# Patient Record
Sex: Female | Born: 1990 | Race: White | Hispanic: No | Marital: Married
Health system: Southern US, Community
[De-identification: ages and names within clinical notes are randomized; demographics above are authoritative.]

## PROBLEM LIST (undated history)

## (undated) DIAGNOSIS — T7840XA Allergy, unspecified, initial encounter: Secondary | ICD-10-CM

## (undated) DIAGNOSIS — J45909 Unspecified asthma, uncomplicated: Secondary | ICD-10-CM

## (undated) DIAGNOSIS — Z87442 Personal history of urinary calculi: Secondary | ICD-10-CM

## (undated) DIAGNOSIS — F329 Major depressive disorder, single episode, unspecified: Secondary | ICD-10-CM

## (undated) DIAGNOSIS — F99 Mental disorder, not otherwise specified: Secondary | ICD-10-CM

## (undated) DIAGNOSIS — F32A Depression, unspecified: Secondary | ICD-10-CM

## (undated) DIAGNOSIS — F419 Anxiety disorder, unspecified: Secondary | ICD-10-CM

## (undated) HISTORY — DX: Allergy, unspecified, initial encounter: T78.40XA

## (undated) HISTORY — DX: Unspecified asthma, uncomplicated: J45.909

---
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
## 2011-11-11 ENCOUNTER — Encounter (HOSPITAL_COMMUNITY): Payer: Self-pay | Admitting: *Deleted

## 2011-11-11 DIAGNOSIS — R45851 Suicidal ideations: Secondary | ICD-10-CM | POA: Insufficient documentation

## 2011-11-11 NOTE — ED Notes (Addendum)
Pt states that she has been under a lot of stress with school work. Pt had a study guide for a test in on e of her hardest classes to complete this weekend and she asked for mom visit. Pt mom said she would see based off of how many questions she finished on her study guide. Pt only finished 21, mom said that she couldn't come. Pt started having thoughts of SI this afternoon, pt states she tried to choke herself a couple of times. Pt was also thinking but did not follow through with cutting herself by stabbing herself but she did not think she could do that, hanging herself but she did not have a rope. Pt very nervous and anxiety currently that her mom will think it is her fault. She keeps repeating that it is not her moms fault. Pt belongings removed and security waunded.

## 2011-11-12 ENCOUNTER — Emergency Department (HOSPITAL_COMMUNITY)
Admission: EM | Admit: 2011-11-12 | Discharge: 2011-11-12 | Disposition: A | Discharge status: Home / Self Care | Payer: BC Managed Care – PPO | Attending: Emergency Medicine | Admitting: Emergency Medicine

## 2011-11-12 DIAGNOSIS — F4320 Adjustment disorder, unspecified: Secondary | ICD-10-CM

## 2011-11-12 NOTE — ED Provider Notes (Signed)
History     CSN: [id number]  Arrival date & time 11/11/11  2137   First MD Initiated Contact with Patient 11/12/11 0017      Chief Complaint  Patient presents with  . Suicidal     Patient is a 21 y.o. female presenting with mental health disorder. The history is provided by the patient.  Mental Health Problem The primary symptoms include dysphoric mood. The current episode started today. This is a new problem.  The onset of the illness is precipitated by a stressful event. The degree of incapacity that she is experiencing as a consequence of her illness is mild. Additional symptoms of the illness include poor judgment.  her course is improving Patient reports that she is under a lot of stress and she tried to harm herself - she tried choking herself with her bare hands.  She took 6 ibuprofen for a headache but no other drugs were ingested.  No cutting was reported.  She denies LOC from the choking episode.  No difficulty swallowing.  No difficulty breathing is reported  She now feels remorse and wish she hadn't hurt herself  PMH - none  History reviewed. No pertinent past surgical history.  History reviewed. No pertinent family history.  History  Substance Use Topics  . Smoking status: Not on file  . Smokeless tobacco: Not on file  . Alcohol Use: Not on file    OB History    Grav Para Term Preterm Abortions TAB SAB Ect Mult Living                  Review of Systems  Constitutional: Negative for fever.  Gastrointestinal: Negative for vomiting.  Neurological: Negative for weakness.  Psychiatric/Behavioral: Positive for dysphoric mood.  All other systems reviewed and are negative.    Allergies  Review of patient's allergies indicates no known allergies.  Home Medications   Current Outpatient Rx  Name Route Sig Dispense Refill  . VITAMIN B COMPLEX PO Oral Take 1 tablet by mouth daily.    . IBUPROFEN 200 MG PO TABS Oral Take 400 mg by mouth every 6 (six) hours as  needed. For pain    . MINOCYCLINE HCL 100 MG PO CAPS Oral Take 100 mg by mouth daily.    Marland Kitchen PRESCRIPTION MEDICATION Oral Take 1 tablet by mouth daily. Oral contraceptive      BP 151/89  Pulse 71  Temp 98.4 F (36.9 C) (Oral)  Resp 16  Ht 5\' 8"  (1.727 m)  Wt 137 lb (62.143 kg)  BMI 20.83 kg/m2  SpO2 99%  Physical Exam CONSTITUTIONAL: Well developed/well nourished HEAD AND FACE: Normocephalic/atraumatic EYES: EOMI/PERRL ENMT: Mucous membranes moist NECK: supple no meningeal signs. No signs of anterior neck trauma SPINE:entire spine nontender CV: S1/S2 noted, no murmurs/rubs/gallops noted LUNGS: Lungs are clear to auscultation bilaterally, no apparent distress ABDOMEN: soft, nontender, no rebound or guarding GU:no cva tenderness NEURO: Pt is awake/alert, moves all extremitiesx4 EXTREMITIES: pulses normal, full ROM SKIN: warm, color normal PSYCH: anxious  ED Course  Procedures   Labs Reviewed  CBC  COMPREHENSIVE METABOLIC PANEL  URINE RAPID DRUG SCREEN (HOSP PERFORMED)  SALICYLATE LEVEL  ACETAMINOPHEN LEVEL  ETHANOL   2:10 AM Pt stable Sitter at bedside D/w ACT Also will consult telepsych  2:38 AM Seen by telepsych (dr Jacky Kindle) He feels patient is safe for d/c She denies current SI She feels safe at home She will see a counselor this week She is awake/alert, appears appropriate  MDM  Nursing notes including past medical history and social history reviewed and considered in documentation Labs/vital reviewed and considered         Joya Gaskins, MD 11/12/11 [phone]

## 2011-11-12 NOTE — ED Notes (Signed)
The patient is AOx4 and comfortable with her discharge instructions. 

## 2013-04-08 ENCOUNTER — Encounter (HOSPITAL_COMMUNITY): Payer: Self-pay | Admitting: Emergency Medicine

## 2013-04-08 ENCOUNTER — Emergency Department (HOSPITAL_COMMUNITY)
Admission: EM | Admit: 2013-04-08 | Discharge: 2013-04-09 | Disposition: A | Discharge status: Other Acute Inpatient Hospital | Payer: BC Managed Care – PPO | Attending: Emergency Medicine | Admitting: Emergency Medicine

## 2013-04-08 DIAGNOSIS — T4272XA Poisoning by unspecified antiepileptic and sedative-hypnotic drugs, intentional self-harm, initial encounter: Secondary | ICD-10-CM | POA: Insufficient documentation

## 2013-04-08 DIAGNOSIS — Z3202 Encounter for pregnancy test, result negative: Secondary | ICD-10-CM | POA: Insufficient documentation

## 2013-04-08 DIAGNOSIS — T50901A Poisoning by unspecified drugs, medicaments and biological substances, accidental (unintentional), initial encounter: Secondary | ICD-10-CM | POA: Diagnosis present

## 2013-04-08 DIAGNOSIS — Z79899 Other long term (current) drug therapy: Secondary | ICD-10-CM | POA: Insufficient documentation

## 2013-04-08 DIAGNOSIS — R45851 Suicidal ideations: Secondary | ICD-10-CM

## 2013-04-08 DIAGNOSIS — T426X1A Poisoning by other antiepileptic and sedative-hypnotic drugs, accidental (unintentional), initial encounter: Secondary | ICD-10-CM | POA: Insufficient documentation

## 2013-04-08 DIAGNOSIS — T4271XA Poisoning by unspecified antiepileptic and sedative-hypnotic drugs, accidental (unintentional), initial encounter: Principal | ICD-10-CM

## 2013-04-08 DIAGNOSIS — R109 Unspecified abdominal pain: Secondary | ICD-10-CM | POA: Insufficient documentation

## 2013-04-08 DIAGNOSIS — F411 Generalized anxiety disorder: Secondary | ICD-10-CM | POA: Insufficient documentation

## 2013-04-08 DIAGNOSIS — T426X2A Poisoning by other antiepileptic and sedative-hypnotic drugs, intentional self-harm, initial encounter: Secondary | ICD-10-CM | POA: Insufficient documentation

## 2013-04-08 HISTORY — DX: Mental disorder, not otherwise specified: F99

## 2013-04-08 HISTORY — DX: Anxiety disorder, unspecified: F41.9

## 2013-04-08 HISTORY — DX: Depression, unspecified: F32.A

## 2013-04-08 HISTORY — DX: Major depressive disorder, single episode, unspecified: F32.9

## 2013-04-08 LAB — RAPID URINE DRUG SCREEN, HOSP PERFORMED
AMPHETAMINES: NOT DETECTED
BARBITURATES: NOT DETECTED
Benzodiazepines: NOT DETECTED
Cocaine: NOT DETECTED
Opiates: NOT DETECTED
TETRAHYDROCANNABINOL: NOT DETECTED

## 2013-04-08 LAB — URINALYSIS, ROUTINE W REFLEX MICROSCOPIC
BILIRUBIN URINE: NEGATIVE
Glucose, UA: NEGATIVE mg/dL
Hgb urine dipstick: NEGATIVE
KETONES UR: NEGATIVE mg/dL
Nitrite: NEGATIVE
PH: 8 (ref 5.0–8.0)
Protein, ur: NEGATIVE mg/dL
Specific Gravity, Urine: 1.02 (ref 1.005–1.030)
UROBILINOGEN UA: 0.2 mg/dL (ref 0.0–1.0)

## 2013-04-08 LAB — CBC
HCT: 42.3 % (ref 36.0–46.0)
Hemoglobin: 14.6 g/dL (ref 12.0–15.0)
MCH: 30.8 pg (ref 26.0–34.0)
MCHC: 34.5 g/dL (ref 30.0–36.0)
MCV: 89.2 fL (ref 78.0–100.0)
Platelets: 214 10*3/uL (ref 150–400)
RBC: 4.74 MIL/uL (ref 3.87–5.11)
RDW: 12.3 % (ref 11.5–15.5)
WBC: 8.3 10*3/uL (ref 4.0–10.5)

## 2013-04-08 LAB — COMPREHENSIVE METABOLIC PANEL
ALT: 11 U/L (ref 0–35)
AST: 18 U/L (ref 0–37)
Albumin: 4.2 g/dL (ref 3.5–5.2)
Alkaline Phosphatase: 68 U/L (ref 39–117)
BUN: 6 mg/dL (ref 6–23)
CO2: 23 mEq/L (ref 19–32)
Calcium: 9.6 mg/dL (ref 8.4–10.5)
Chloride: 101 mEq/L (ref 96–112)
Creatinine, Ser: 0.69 mg/dL (ref 0.50–1.10)
GFR calc Af Amer: >90 mL/min (ref >90)
GFR calc non Af Amer: >90 mL/min (ref >90)
Glucose, Bld: 93 mg/dL (ref 70–99)
Potassium: 4.3 mEq/L (ref 3.7–5.3)
Sodium: 138 mEq/L (ref 137–147)
Total Bilirubin: 1.8 mg/dL — ABNORMAL HIGH (ref 0.3–1.2)
Total Protein: 7.9 g/dL (ref 6.0–8.3)

## 2013-04-08 LAB — URINE MICROSCOPIC-ADD ON

## 2013-04-08 LAB — SALICYLATE LEVEL: Salicylate Lvl: <2 mg/dL — ABNORMAL LOW (ref 2.8–20.0)

## 2013-04-08 LAB — POC URINE PREG, ED: Preg Test, Ur: NEGATIVE

## 2013-04-08 LAB — ETHANOL: Alcohol, Ethyl (B): <11 mg/dL (ref 0–11)

## 2013-04-08 LAB — ACETAMINOPHEN LEVEL: Acetaminophen (Tylenol), Serum: <15 ug/mL (ref 10–30)

## 2013-04-08 MED ORDER — LORAZEPAM 0.5 MG PO TABS: 0.5000 mg | ORAL_TABLET | Freq: Three times a day (TID) | ORAL | Status: DC | PRN

## 2013-04-08 MED ORDER — LORAZEPAM 1 MG PO TABS: 1.0000 mg | ORAL_TABLET | Freq: Three times a day (TID) | ORAL | Status: DC | PRN

## 2013-04-08 NOTE — ED Notes (Signed)
Bed: WA08 Expected date:  Expected time:  Means of arrival:  Comments: OD

## 2013-04-08 NOTE — BH Assessment (Signed)
Spoke with EDP Dr. Romeo AppleHarrison to obtain clinicals prior to assessing patient. Dr.Harrison reports that he is in a procedure and is unable to talk with TTS at this time. EDP agreed to call TTS when he is available.   Glorious PeachNajah , MS, LCASA Assessment Counselor

## 2013-04-08 NOTE — ED Notes (Signed)
Patient observed in bed. Appears sad. Denies SI, HI, AVH at present. Rates anxiety 4/10, depression 5/10. Contracts verbally for safety. Patient is calm, cooperative.   Encouragement offered. Patient safety maintained, Q 15 checks in place.

## 2013-04-08 NOTE — ED Notes (Signed)
Pt ambulatory with steady gait to void in BR.  

## 2013-04-08 NOTE — Progress Notes (Signed)
[facility] [employer] has called off drivers d/t current weather conditions. They plan to wait until roads have been cleared in the morning before transporting.

## 2013-04-08 NOTE — ED Notes (Signed)
Follow up from previous shift: EDP Rebecca Clay reviewed U/A, sees no need for intervention at present.

## 2013-04-08 NOTE — BH Assessment (Signed)
TTS notified by patient's nurse Carrolyn LeighMegan Rhodes that [facility] by [facility]. Due to road conditions [facility] is unable to transport patient at this time.  Glorious PeachNajah , MS, LCASA Assessment Counselor

## 2013-04-08 NOTE — ED Notes (Addendum)
Patient and belongings have been wanded by security. Patient belongings are gray sweat pants, navy boxers, white socks, tan shoes, wallet, black shirt, silver ring, silver stud earrings, green watch, silver necklace. Belongings are locked up in TCU locker 26.

## 2013-04-08 NOTE — BH Assessment (Signed)
TTS consult complete.   , MS, LCASA Assessment Counselor  

## 2013-04-08 NOTE — Progress Notes (Signed)
Patient spoke with mother Estanislado SpireKaren Peedin. Became tearful.  Patient safety maintained, Q 15 checks continue.

## 2013-04-08 NOTE — ED Provider Notes (Signed)
CSN: [id number]     Arrival date & time 04/08/13  1423 History   First MD Initiated Contact with Patient 04/08/13 1453     Chief Complaint  Patient presents with  . Drug Overdose     (Consider location/radiation/quality/duration/timing/severity/associated sxs/prior Treatment) Patient is a [age] y.o. female presenting with Overdose. The history is provided by the patient.  Drug Overdose This is a new problem. The current episode started 12 to 24 hours ago. Episode frequency: once. The problem has been rapidly improving. Associated symptoms include abdominal pain. Pertinent negatives include no chest pain, no headaches and no shortness of breath. Exacerbated by: recent separation from partner. Nothing relieves the symptoms. She has tried nothing for the symptoms. The treatment provided no relief.    Past Medical History  Diagnosis Date  . Anxiety    History reviewed. No pertinent past surgical history. No family history on file. History  Substance Use Topics  . Smoking status: Never Smoker   . Smokeless tobacco: Not on file  . Alcohol Use: Yes   OB History   Grav Para Term Preterm Abortions TAB SAB Ect Mult Living                 Review of Systems  Constitutional: Negative for fever and fatigue.  HENT: Negative for congestion and drooling.   Eyes: Negative for pain.  Respiratory: Negative for cough and shortness of breath.   Cardiovascular: Negative for chest pain.  Gastrointestinal: Positive for abdominal pain. Negative for nausea, vomiting and diarrhea.  Genitourinary: Negative for dysuria and hematuria.  Musculoskeletal: Negative for back pain, gait problem and neck pain.  Skin: Negative for color change.  Neurological: Negative for dizziness and headaches.  Hematological: Negative for adenopathy.  Psychiatric/Behavioral: Negative for behavioral problems.  All other systems reviewed and are negative.      Allergies  Review of patient's allergies indicates no known  allergies.  Home Medications   Current Outpatient Rx  Name  Route  Sig  Dispense  Refill  . escitalopram (LEXAPRO) 20 MG tablet   Oral   Take 1 tablet by mouth at bedtime.         Marland Kitchen. PRESCRIPTION MEDICATION   Oral   Take 1 tablet by mouth daily. Oral contraceptive         . zolpidem (AMBIEN) 5 MG tablet   Oral   Take 1 tablet by mouth at bedtime as needed.          BP 140/96  Pulse 113  Temp(Src) 98.2 F (36.8 C) (Oral)  Resp 20  SpO2 96% Physical Exam  Nursing note and vitals reviewed. Constitutional: She is oriented to person, place, and time. She appears well-developed and well-nourished.  HENT:  Head: Normocephalic and atraumatic.  Mouth/Throat: Oropharynx is clear and moist. No oropharyngeal exudate.  Eyes: Conjunctivae and EOM are normal. Pupils are equal, round, and reactive to light.  Neck: Normal range of motion. Neck supple.  Cardiovascular: Normal rate, regular rhythm, normal heart sounds and intact distal pulses.  Exam reveals no gallop and no friction rub.   No murmur heard. Pulmonary/Chest: Effort normal and breath sounds normal. No respiratory distress. She has no wheezes.  Abdominal: Soft. Bowel sounds are normal. There is no tenderness. There is no rebound and no guarding.  Musculoskeletal: Normal range of motion. She exhibits no edema and no tenderness.  Several horizontal abrasions to the left ventral forearm.  Neurological: She is alert and oriented to person, place, and time.  Skin: Skin is warm and dry.  Psychiatric: She has a normal mood and affect. Her behavior is normal.    ED Course  Procedures (including critical care time) Labs Review Labs Reviewed  COMPREHENSIVE METABOLIC PANEL - Abnormal; Notable for the following:    Total Bilirubin 1.8 (*)    All other components within normal limits  SALICYLATE LEVEL - Abnormal; Notable for the following:    Salicylate Lvl <2.0 (*)    All other components within normal limits  URINALYSIS,  ROUTINE W REFLEX MICROSCOPIC - Abnormal; Notable for the following:    APPearance TURBID (*)    Leukocytes, UA SMALL (*)    All other components within normal limits  URINE MICROSCOPIC-ADD ON - Abnormal; Notable for the following:    Squamous Epithelial / LPF FEW (*)    Bacteria, UA FEW (*)    All other components within normal limits  CBC  ETHANOL  ACETAMINOPHEN LEVEL  URINE RAPID DRUG SCREEN (HOSP PERFORMED)  POC URINE PREG, ED   Imaging Review No results found.  EKG Interpretation    Date/Time:  Wednesday April 08 2013 14:32:45 EST Ventricular Rate:  105 PR Interval:  160 QRS Duration: 96 QT Interval:  364 QTC Calculation: 481 R Axis:   42 Text Interpretation:  Sinus tachycardia RSR' in V1 or V2, probably normal variant Borderline prolonged QT interval Baseline wander in lead(s) I Confirmed by   MD,  ([id number]) on 04/08/2013 10:54:34 PM            MDM   Final diagnoses:  Overdose  Suicidal ideations    3:32 PM 23 y.o. female who presents with attempted overdose. The patient states that she was upset about a recent separation with a partner. She states that she took less than 15 tablets of 5 mg Ambien last night sometime between midnight and 1 AM. She denies any other coingestants. She woke at 1 PM today and her friend called the ambulance. She states that she felt disoriented at that time. She is alert and oriented currently. She complains of some mild periumbilical pain has no other complaints. She is afebrile and vital signs are unremarkable on my exam. Will get screening labs and psych consult.  8:49 PM Medically cleared. Accepted to [facility]. Will transfer.   Junius Argyle, MD 04/08/13 2255

## 2013-04-08 NOTE — ED Notes (Signed)
Removed capped IV from left anicubital.

## 2013-04-08 NOTE — ED Notes (Signed)
Per EMS: pt took unknown amount of zolpidem last night around midnight. Pt was attempting to hurt herself. Pt has fresh cut marks on left wrist, scabbed over. 20 g left AC.

## 2013-04-08 NOTE — ED Notes (Signed)
Scheduled medications to pharmacy.  TTS in to assess.

## 2013-04-08 NOTE — BH Assessment (Addendum)
Assessment Note  Rebecca Clay is an 23 y.o. female. [facility] with [facility]/O Medical Clearance after intentional overdose on an unknown amount of her ambien sleeping pills. Patient reports that she recently broke up with her partner on 02-23-13. Patient reports that her ex-partner sent a message to her on Facebook several days ago telling patient that she wanted to "fuck" her,this was clarified as patient's ex-partner wanting patient to come over and have sex with her. Patient reports that she broke down and began crying. Patient states that she felt that she could no longer continue in the relationship with the perks to include sex, kissing as patient describes the perks of the relationship as a "fun bubble". Pt reports that she did not want to continue in the relationship without a commitment. Patient reports that she informed her partner that she could not continue in the relationship without a commitment and her ex-partner informed her that she no longer wanted any contact with the patient. Patient reports that his made her feel sad and sent her over the edge. Patient begins crying during assessment reporting that she misses her ex-girlfriend and can't cope with not being with her. Patient reports that she cut on her arm on 04-03-13 after having her last conversation with her ex-girlfriend. Pt denies prior history of cutting.Pt reports prior history of suicide attempt in the fall of 2014 when she started college. Patient reports that she could not handle the stress and attempted to kill herself by choking herself. Pt reports that she has been diagnosed with Auditory Processing Recessive Disorder Learning Disability. Pt denies HI and no AVH reported. Patient is unable to contract for safety and inpatient treatment is recommended for safety and stabilization.  Consulted with AC Thurman CoyerEric Kaplan and Donell SievertSpencer Simon Psychiatric extender who agreed to [facility]. Notified EDP Dr.Harrison  [facility]. Pt is assigned to 508-1 assigned to Dr.Jonnalagadda for care.Support paperwork complete.       Axis I: Major Depressive Disorder,Recurrent Severe without Psychotic Features Axis II: Deferred Axis III:  Past Medical History  Diagnosis Date  . Anxiety   . Mental disorder   . Depression    Axis IV: other psychosocial or environmental problems and problems related to social environment Axis V: 31-40 impairment in reality testing  Past Medical History:  Past Medical History  Diagnosis Date  . Anxiety   . Mental disorder   . Depression     History reviewed. No pertinent past surgical history.  Family History: No family history on file.  Social History:  reports that she has never smoked. She does not have any smokeless tobacco history on file. She reports that she drinks alcohol. She reports that she does not use illicit drugs.  Additional Social History:  Alcohol / Drug Use History of alcohol / drug use?: No history of alcohol / drug abuse  CIWA: CIWA-Ar BP: 152/99 mmHg Pulse Rate: 105 COWS:    Allergies: No Known Allergies  Home Medications:  (Not in a hospital admission)  OB/GYN Status:  No LMP recorded.  General Assessment Data Location of Assessment: [facility] ED Is this a Tele or Face-to-Face Assessment?: Face-to-Face Is this an Initial Assessment or a Re-assessment for this encounter?: Initial Assessment Living Arrangements: Other (Comment) (Pt lives in campus apt. with 3 roommates) Can pt return to current living arrangement?: Yes Admission Status: Voluntary Is patient capable of signing voluntary admission?: Yes Transfer from: Home Referral Source: Other ([facility])  [facility] Crisis Care Plan Living Arrangements: Other (Comment) (Pt lives in campus apt. with 3 roommates) Name of Psychiatrist: Dr. Bobby Rumpf at [facility] Name of Therapist: [facility]  Education Status Is patient currently in school?: Yes (Patient  currently attends [employer]) Current Grade: Junior Classification Highest grade of school patient has completed: 12th grade and 2 years of college Name of school: Haematologist person: NA  Risk to self Suicidal Ideation: Yes-Currently Present Suicidal Intent: Yes-Currently Present Is patient at risk for suicide?: Yes Suicidal Plan?: Yes-Currently Present Specify Current Suicidal Plan: Pt overdosed on unknown amount of ambien Access to Means: Yes Specify Access to Suicidal Means: pt access to rx meds What has been your use of drugs/alcohol within the last 12 months?: None Reported Previous Attempts/Gestures: Yes How many times?: 1 (prior overdose in the fall of 2013 when pt started college) Other Self Harm Risks: Cutting Triggers for Past Attempts: Other personal contacts (recent break up with partner ) Intentional Self Injurious Behavior: Cutting Comment - Self Injurious Behavior: pt recently cut on her arms on 04-03-13 due to conflict with her ex partner Family Suicide History: No Recent stressful life event(s): Other (Comment) (academic stress,difficulty keeping up with coursework) Persecutory voices/beliefs?: No Depression: Yes Depression Symptoms: Tearfulness;Feeling worthless/self pity;Feeling angry/irritable Substance abuse history and/or treatment for substance abuse?: No Suicide prevention information given to non-admitted patients: Not applicable  Risk to Others Homicidal Ideation: No Thoughts of Harm to Others: No Current Homicidal Intent: No Current Homicidal Plan: No Access to Homicidal Means: No Identified Victim: na History of harm to others?: No Assessment of Violence: None Noted Violent Behavior Description: Cooperative,tearful during TTS assessment Does patient have access to weapons?: No Criminal Charges Pending?: No Does patient have a court date: No  Psychosis Hallucinations: None noted Delusions: None noted  Mental Status Report Appear/Hygiene:   (Appropriate) Eye Contact: Fair Motor Activity: Unremarkable Speech: Logical/coherent Level of Consciousness: Alert Mood: Depressed Affect: Depressed Anxiety Level: Minimal Thought Processes: Coherent;Relevant Judgement: Unimpaired Orientation: Person;Place;Time;Situation Obsessive Compulsive Thoughts/Behaviors: None  Cognitive Functioning Concentration: Normal Memory: Recent Intact;Remote Intact IQ: Average Insight: Good Impulse Control: Poor Appetite: Good Weight Loss: 0 Weight Gain: 0 Sleep: No Change Total Hours of Sleep: 6 Vegetative Symptoms: None  ADLScreening [facility] Assessment Services) Patient's cognitive ability adequate to safely complete daily activities?: Yes Patient able to express need for assistance with ADLs?: Yes Independently performs ADLs?: Yes (appropriate for developmental age)  Prior Inpatient Therapy Prior Inpatient Therapy: Yes Prior Therapy Dates: 2013? Prior Therapy Facilty/Provider(s): [facility] Reason for Treatment: SI,(pt reports that she tried to  choke herself)  Prior Outpatient Therapy Prior Outpatient Therapy: Yes Prior Therapy Dates: Current Provider Prior Therapy Facilty/Provider(s): [facility], [employer] Reason for Treatment: Outpatient Therapy for relationship conflict and family discord  ADL Screening (condition at time of admission) Patient's cognitive ability adequate to safely complete daily activities?: Yes Is the patient deaf or have difficulty hearing?: No Does the patient have difficulty seeing, even when wearing glasses/contacts?: No Does the patient have difficulty concentrating, remembering, or making decisions?: No Patient able to express need for assistance with ADLs?: Yes Does the patient have difficulty dressing or bathing?: No Independently performs ADLs?: Yes (appropriate for developmental age) Does the patient have difficulty walking or climbing stairs?: No Weakness of Legs: None Weakness of Arms/Hands:  None  Home Assistive Devices/Equipment Home Assistive Devices/Equipment: None    Abuse/Neglect Assessment (Assessment to be complete while patient is alone) Physical Abuse: Denies Verbal Abuse: Denies Sexual Abuse: Denies  Exploitation of patient/patient's resources: Denies Self-Neglect: Denies Values / Beliefs Cultural Requests During Hospitalization: None Spiritual Requests During Hospitalization: None   Advance Directives (For Healthcare) Advance Directive: Patient does not have advance directive;Patient would not like information    Additional Information 1:1 In Past 12 Months?: No CIRT Risk: No Elopement Risk: No Does patient have medical clearance?: Yes     Disposition:  Disposition Initial Assessment Completed for this Encounter: Yes Disposition of Patient: Inpatient treatment program Type of inpatient treatment program: Adult  On Site Evaluation by:   Reviewed with Physician:    Gerline Legacy, MS, LCASA Assessment Counselor  04/08/2013 10:16 PM

## 2013-04-08 NOTE — BH Assessment (Signed)
Consulted with AC Thurman CoyerEric Kaplan and Donell SievertSpencer Simon Psychiatric extender who agreed to [facility]. Not[facility] EDP Dr.Harrison [facility]. Pt is assigned to 508-1 assigned to Dr.Jonnalagadda for care.Support paperwork complete.  Glorious PeachNajah , MS, LCASA Assessment Counselor

## 2013-04-09 ENCOUNTER — Encounter (HOSPITAL_COMMUNITY): Payer: Self-pay | Admitting: Behavioral Health

## 2013-04-09 ENCOUNTER — Encounter (HOSPITAL_COMMUNITY): Payer: Self-pay | Admitting: Registered Nurse

## 2013-04-09 ENCOUNTER — Inpatient Hospital Stay (HOSPITAL_COMMUNITY)
Admission: AD | Admit: 2013-04-09 | Discharge: 2013-04-13 | DRG: 885 | Disposition: A | Discharge status: Home / Self Care | Payer: 59 | Source: Intra-hospital | Attending: Psychiatry | Admitting: Psychiatry

## 2013-04-09 DIAGNOSIS — T50901A Poisoning by unspecified drugs, medicaments and biological substances, accidental (unintentional), initial encounter: Secondary | ICD-10-CM

## 2013-04-09 DIAGNOSIS — R45851 Suicidal ideations: Secondary | ICD-10-CM

## 2013-04-09 DIAGNOSIS — F332 Major depressive disorder, recurrent severe without psychotic features: Principal | ICD-10-CM | POA: Diagnosis present

## 2013-04-09 MED ORDER — ESCITALOPRAM OXALATE 20 MG PO TABS
20.0000 mg | ORAL_TABLET | Freq: Every day | ORAL | Status: DC
  Administered 2013-04-09 – 2013-04-13 (×5): 20 mg via ORAL
  Filled 2013-04-09 (×3): qty 1
  Filled 2013-04-09: qty 7
  Filled 2013-04-09 (×4): qty 1

## 2013-04-09 MED ORDER — TRAZODONE HCL 50 MG PO TABS
50.0000 mg | ORAL_TABLET | Freq: Every evening | ORAL | Status: DC | PRN
  Filled 2013-04-09: qty 7

## 2013-04-09 MED ORDER — ACETAMINOPHEN 325 MG PO TABS
650.0000 mg | ORAL_TABLET | Freq: Four times a day (QID) | ORAL | Status: DC | PRN
  Administered 2013-04-10: 650 mg via ORAL
  Filled 2013-04-09: qty 2

## 2013-04-09 MED ORDER — HYDROXYZINE HCL 25 MG PO TABS: 25.0000 mg | ORAL_TABLET | Freq: Four times a day (QID) | ORAL | Status: DC | PRN

## 2013-04-09 MED ORDER — MAGNESIUM HYDROXIDE 400 MG/5ML PO SUSP: 30.0000 mL | Freq: Every day | ORAL | Status: DC | PRN

## 2013-04-09 MED ORDER — ALUM & MAG HYDROXIDE-SIMETH 200-200-20 MG/5ML PO SUSP: 30.0000 mL | ORAL | Status: DC | PRN

## 2013-04-09 NOTE — Progress Notes (Signed)
Adult Psychoeducational Group Note  Date:  04/09/2013 Time: 8:30 PM  Group Topic/Focus:  Wrap-Up Group:   The focus of this group is to help patients review their daily goal of treatment and discuss progress on daily workbooks.  Participation Level:  Minimal  Participation Quality:  Appropriate and Attentive  Affect:  Appropriate  Cognitive:  Appropriate  Insight: Appropriate and Good  Engagement in Group:  Engaged  Modes of Intervention:  Discussion and Support  Additional Comments:  Pt shared that in her leisure time she likes to listen to music, draw, and hang out with her friends.   Humberto SealsWhitaker,  Monique 04/09/2013, 10:10 PM

## 2013-04-09 NOTE — Progress Notes (Signed)
Recreation Therapy Notes  Date: 02.26.2015 Time: 2:45pm Location: [address] Dayroom   Group Topic: Leisure Education  Goal Area(s) Addresses:  Patient will identify positive leisure activities.  Patient will identify positive emotions associated with leisure participation.  Patient will identify one positive benefit of participation in leisure activities.   Behavioral Response: Engaged, Appropriate   Intervention: Game  Activity: LRT selected a letter from a bag, using the selected letter patients were asked to identify as many leisure activities as possible that start with the selected letter in 3 minutes. Patients worked in teams of 4. Last round of game was used to ask patients to identify positive emotions associated with leisure participation.   Education:  Leisure Programme researcher, broadcasting/film/videoducation, Building control surveyorDischarge Planning, PharmacologistCoping Skills   Education Outcome: Acknowledges understanding  Clinical Observations/Feedback: Patient actively engaged in group activity, working well with teammates and identifying words to contribute to team list. Patient made no contributions to group discussion, but appeared to actively listen as she maintained appropriate eye contact with speaker and nodded in agreement with points of interest.   Jearl Klinefelterenise L , LRT/CTRS  Jearl Klinefelter,  L 04/09/2013 4:03 PM

## 2013-04-09 NOTE — Progress Notes (Signed)
Patient ID: Rebecca Clay, female   DOB: 1990-09-18, 23 y.o.   MRN: [id number]  [facility] 9:00 A.M.  The focus of this group is to educate the patient on the purpose and policies of crisis stabilization and provide a format to answer questions about their admission.  The group details unit policies and expectations of patients while admitted.  Patient did not attend group.

## 2013-04-09 NOTE — H&P (Signed)
Psychiatric Admission Assessment Adult  Patient Identification:  Rebecca Clay Date of Evaluation:  04/09/2013 Chief Complaint:  MAJOR DEPRESSIVE DISORDER  History of Present Illness:: Rebecca Clay is an 23 y.o. female. Pt presents to [facility] with C/O Medical Clearance after intentional overdose on an unknown amount of her ambien sleeping pills. Patient reports that she recently broke up with her partner on 02-23-13. Patient reports that her ex-partner sent a message to her on Facebook several days ago telling patient that she wanted to "fuck" her,this was clarified as patient's ex-partner wanting patient to come over and have sex with her. Patient reports that she broke down and began crying. Patient states that she felt that she could no longer continue in the relationship with the perks to include sex, kissing as patient describes the perks of the relationship as a "fun bubble". Pt reports that she did not want to continue in the relationship without a commitment. Patient reports that she informed her partner that she could not continue in the relationship without a commitment and her ex-partner informed her that she no longer wanted any contact with the patient. Patient reports that his made her feel sad and sent her over the edge. Patient begins crying during assessment reporting that she misses her ex-girlfriend and can't cope with not being with her. Patient reports that she cut on her arm on 04-03-13 after having her last conversation with her ex-girlfriend. Pt denies prior history of cutting.Pt reports prior history of suicide attempt in the fall of 2014 when she started college. Patient reports that she could not handle the stress and attempted to kill herself by choking herself. Pt reports that she has been diagnosed with Auditory Processing Recessive Disorder Learning Disability. Pt denies HI and no AVH reported. Patient is unable to contract for safety and inpatient treatment is recommended for safety and  stabilization.   During admission assessment, pt rates anxiety at 5/10 and minimizes depression. Pt denies HI and AVH, but affirms SI with thoughts of choking/cutting although transient. Contracts for safety. Pt reports that she broke up with her girlfriend and that this upset her and made her depressed, then that she became intimate again with her ex (same person) and that her ex posted a message on her facebook that was very hurtful about "telling me to just get over it already" and that this sent her into a state of despair where she cut herself and came to the ED. Pt also stated that she "Overdosed on Ambien, but not sure how much". Pt is in agreement with medication and treatment plan at this time.    Elements:  Location:  Generalized, inpatient [facility]. Quality:  Worsening. Severity:  Severe. Timing:  Constant. Duration:  2-4 weeks, intermittent, worsening with social media posts. Associated Signs/Synptoms: Depression Symptoms:  depressed mood, insomnia, psychomotor retardation, fatigue, feelings of worthlessness/guilt, difficulty concentrating, hopelessness, suicidal thoughts with specific plan, anxiety, loss of energy/fatigue, (Hypo) Manic Symptoms:  Denies Anxiety Symptoms:  Excessive Worry, Psychotic Symptoms:  Denies PTSD Symptoms: Denies Total Time spent with patient: Greater than 45 minutes  Psychiatric Specialty Exam: Physical Exam Full Physical Exam performed in ED; reviewed, stable, and I concur with this assessment.   Review of Systems  Constitutional: Negative.   HENT: Negative.   Eyes: Negative.   Respiratory: Negative.   Cardiovascular: Negative.   Gastrointestinal: Negative.   Genitourinary: Negative.   Musculoskeletal: Negative.   Skin: Negative.   Neurological: Negative.   Endo/Heme/Allergies: Negative.   Psychiatric/Behavioral: Positive for  depression and suicidal ideas. Negative for hallucinations and substance abuse. The patient is nervous/anxious and  has insomnia.     Blood pressure 126/84, pulse 116, temperature 97.6 F (36.4 C), temperature source Oral, resp. rate 16, height 5' 8"  (1.727 m), weight 65.318 kg (144 lb), last menstrual period 11/21/2011, SpO2 97.00%.Body mass index is 21.9 kg/(m^2).  General Appearance: Casual  Eye Contact::  Good  Speech:  Clear and Coherent  Volume:  Normal  Mood:  Depressed  Affect:  Depressed  Thought Process:  Coherent  Orientation:  Full (Time, Place, and Person)  Thought Content:  Rumination  Suicidal Thoughts:  Yes.  with intent/plan  Homicidal Thoughts:  No  Memory:  Immediate;   Good Recent;   Good Remote;   Good  Judgement:  Fair  Insight:  Fair  Psychomotor Activity:  Decreased  Concentration:  Good  Recall:  Good  Fund of Knowledge:Good  Language: Good  Akathisia:  NA  Handed:  Right  AIMS (if indicated):     Assets:  Communication Skills Desire for Improvement Resilience  Sleep:       Musculoskeletal: Strength & Muscle Tone: within normal limits Gait & Station: normal Patient leans: N/A  Past Psychiatric History: Diagnosis: MDD with SI with plan  Hospitalizations: Denies  Outpatient Care: [facility]  Substance Abuse Care: Denies  Self-Mutilation: Reports "only this part on my left arm, showing superficial lacerations"  Suicidal Attempts: Pt reports just this time, but "not real attempt", Ambien OD (attempt)  Violent Behaviors: Denies   Past Medical History:   Past Medical History  Diagnosis Date  . Anxiety   . Mental disorder   . Depression    None. Allergies:  No Known Allergies PTA Medications: Prescriptions prior to admission  Medication Sig Dispense Refill  . escitalopram (LEXAPRO) 20 MG tablet Take 1 tablet by mouth at bedtime.      Marland Kitchen PRESCRIPTION MEDICATION Take 1 tablet by mouth daily. Oral contraceptive      . zolpidem (AMBIEN) 5 MG tablet Take 1 tablet by mouth at bedtime as needed.        Previous  Psychotropic Medications:  Medication/Dose  SEE MAR Lexapro 64m               Substance Abuse History in the last 12 months:  no  Consequences of Substance Abuse: NA  Social History:  reports that she has never smoked. She does not have any smokeless tobacco history on file. She reports that she drinks alcohol. She reports that she does not use illicit drugs. 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 No pertinent family history.  Results for orders placed during the hospital encounter of 04/08/13 (from the past 72 hour(s))  CBC     Status: None   Collection Time    04/08/13  3:10 PM      Result Value Ref Range   WBC 8.3  4.0 - 10.5 K/uL   RBC 4.74  3.87 - 5.11  MIL/uL   Hemoglobin 14.6  12.0 - 15.0 g/dL   HCT 42.3  36.0 - 46.0 %   MCV 89.2  78.0 - 100.0 fL   MCH 30.8  26.0 - 34.0 pg   MCHC 34.5  30.0 - 36.0 g/dL   RDW 12.3  11.5 - 15.5 %   Platelets 214  150 - 400 K/uL  COMPREHENSIVE METABOLIC PANEL     Status: Abnormal   Collection Time    04/08/13  3:10 PM      Result Value Ref Range   Sodium 138  137 - 147 mEq/L   Potassium 4.3  3.7 - 5.3 mEq/L   Chloride 101  96 - 112 mEq/L   CO2 23  19 - 32 mEq/L   Glucose, Bld 93  70 - 99 mg/dL   BUN 6  6 - 23 mg/dL   Creatinine, Ser 0.69  0.50 - 1.10 mg/dL   Calcium 9.6  8.4 - 10.5 mg/dL   Total Protein 7.9  6.0 - 8.3 g/dL   Albumin 4.2  3.5 - 5.2 g/dL   AST 18  0 - 37 U/L   ALT 11  0 - 35 U/L    Alkaline Phosphatase 68  39 - 117 U/L   Total Bilirubin 1.8 (*) 0.3 - 1.2 mg/dL   GFR calc non Af Amer >90  >90 mL/min   GFR calc Af Amer >90  >90 mL/min   Comment: (NOTE)     The eGFR has been calculated using the CKD EPI equation.     This calculation has not been validated in all clinical situations.     eGFR's persistently <90 mL/min signify possible Chronic Kidney     Disease.  ETHANOL     Status: None   Collection Time    04/08/13  3:10 PM      Result Value Ref Range   Alcohol, Ethyl (B) <11  0 - 11 mg/dL   Comment:            LOWEST DETECTABLE LIMIT FOR     SERUM ALCOHOL IS 11 mg/dL     FOR MEDICAL PURPOSES ONLY  ACETAMINOPHEN LEVEL     Status: None   Collection Time    04/08/13  3:10 PM      Result Value Ref Range   Acetaminophen (Tylenol), Serum <15.0  10 - 30 ug/mL   Comment:            THERAPEUTIC CONCENTRATIONS VARY     SIGNIFICANTLY. A RANGE OF 10-30     ug/mL MAY BE AN EFFECTIVE     CONCENTRATION FOR MANY PATIENTS.     HOWEVER, SOME ARE BEST TREATED     AT CONCENTRATIONS OUTSIDE THIS     RANGE.     ACETAMINOPHEN CONCENTRATIONS     >150 ug/mL AT 4 HOURS AFTER     INGESTION AND >50 ug/mL AT 12     HOURS AFTER INGESTION ARE     OFTEN ASSOCIATED WITH TOXIC     REACTIONS.  SALICYLATE LEVEL     Status: Abnormal   Collection Time    04/08/13  3:10 PM      Result Value Ref Range   Salicylate Lvl <5.8 (*) 2.8 - 20.0 mg/dL  URINE RAPID DRUG SCREEN (HOSP PERFORMED)     Status: None   Collection Time    04/08/13  3:46 PM      Result Value Ref Range   Opiates NONE DETECTED  NONE DETECTED   Cocaine NONE DETECTED  NONE DETECTED   Benzodiazepines NONE DETECTED  NONE DETECTED   Amphetamines NONE DETECTED  NONE DETECTED   Tetrahydrocannabinol NONE DETECTED  NONE DETECTED   Barbiturates NONE DETECTED  NONE DETECTED   Comment:            DRUG SCREEN FOR MEDICAL PURPOSES     ONLY.  IF CONFIRMATION IS NEEDED     FOR ANY PURPOSE, NOTIFY LAB     WITHIN 5 DAYS.                 LOWEST DETECTABLE LIMITS     FOR URINE DRUG SCREEN     Drug Class       Cutoff (ng/mL)     Amphetamine      1000     Barbiturate      200     Benzodiazepine   235     Tricyclics       573     Opiates          300     Cocaine          300     THC              50  URINALYSIS, ROUTINE W REFLEX MICROSCOPIC     Status: Abnormal   Collection Time    04/08/13  3:46 PM      Result Value Ref Range   Color, Urine YELLOW  YELLOW   APPearance TURBID (*) CLEAR   Specific Gravity, Urine 1.020  1.005 - 1.030   pH 8.0  5.0 - 8.0   Glucose, UA NEGATIVE  NEGATIVE mg/dL   Hgb urine dipstick NEGATIVE  NEGATIVE   Bilirubin Urine NEGATIVE  NEGATIVE   Ketones, ur NEGATIVE  NEGATIVE mg/dL   Protein, ur NEGATIVE  NEGATIVE mg/dL   Urobilinogen, UA 0.2  0.0 - 1.0 mg/dL   Nitrite NEGATIVE  NEGATIVE   Leukocytes, UA SMALL (*) NEGATIVE  URINE MICROSCOPIC-ADD ON     Status: Abnormal   Collection Time    04/08/13  3:46 PM      Result Value Ref Range   Squamous Epithelial / LPF FEW (*) RARE   WBC, UA 0-2  <3 WBC/hpf   RBC / HPF 3-6  <3 RBC/hpf   Bacteria, UA FEW (*) RARE   Urine-Other MUCOUS PRESENT     Comment: AMORPHOUS URATES/PHOSPHATES  POC URINE PREG, ED     Status: None   Collection Time    04/08/13  3:52 PM      Result Value Ref Range   Preg Test, Ur NEGATIVE  NEGATIVE   Comment:            THE SENSITIVITY OF THIS     METHODOLOGY IS >24 mIU/mL   Psychological Evaluations:  Assessment:   DSM5: Depressive Disorders:  Major Depressive Disorder - Severe (296.23)  AXIS I:  Major Depression, Recurrent severe AXIS II:  Deferred AXIS III:   Past Medical History  Diagnosis Date  . Anxiety   . Mental disorder   . Depression    AXIS IV:  other psychosocial or environmental problems, problems related to social environment and problems with primary support group AXIS V:  41-50 serious symptoms  Treatment Plan/Recommendations:   Review of chart, vital signs, medications, and notes.   1-Individual and group therapy  2-Medication management for depression and anxiety: Medications reviewed with the patient and she stated no untoward  effects. -Add Vistaril 24m po q6h for anxiety 3-Coping skills for depression, anxiety  4-Continue crisis stabilization and management  5-Address health issues--monitoring vital signs, stable  6-Treatment plan in progress to prevent relapse of depression and anxiety  Treatment Plan Summary: Daily contact with patient to assess and evaluate symptoms and progress in treatment Medication management Current Medications:  Current Facility-Administered Medications  Medication Dose Route Frequency Provider Last Rate Last Dose  . acetaminophen (TYLENOL) tablet 650 mg  650 mg Oral Q6H PRN JDurward Parcel MD      . alum & mag hydroxide-simeth (MAALOX/MYLANTA) 200-200-20 MG/5ML suspension 30 mL  30 mL Oral Q4H PRN JDurward Parcel MD      . escitalopram (LEXAPRO) tablet 20 mg  20 mg Oral Daily JDurward Parcel MD   20 mg at 04/09/13 1214  . hydrOXYzine (ATARAX/VISTARIL) tablet 25 mg  25 mg Oral Q6H PRN JBenjamine Mola FNP      . magnesium hydroxide (MILK OF MAGNESIA) suspension 30 mL  30 mL Oral Daily PRN JDurward Parcel MD      . traZODone (DESYREL) tablet 50 mg  50 mg Oral QHS PRN JDurward Parcel MD        Observation Level/Precautions:  15 minute checks  Laboratory:  Labs resulted, reviewed, and stable at this time.   Psychotherapy:  Group therapy, individual therapy, psychoeducation  Medications:  See MAR above  Consultations: None    Discharge Concerns: None    Estimated LOS: 5-7 days  Other:  N/A   I certify that inpatient services furnished can reasonably be expected to improve the patient's condition.   WBenjamine Mola FHawaii2/26/20157:45 PM  Patient was seen face to face for psychiatric assessment and case discussed with physician extender, completed suicidal risk assessment and  formulated treatment plan. Reviewed the information documented and agree with the treatment plan.  ,JANARDHAHA R. 04/10/2013 7:03 PM

## 2013-04-09 NOTE — [facility] Suicide Risk Assessment (Signed)
   Nursing information obtained from:    Demographic factors:    Current Mental Status:    Loss Factors:    Historical Factors:    Risk Reduction Factors:    Total Time spent with patient: 45 minutes  CLINICAL FACTORS:   Severe Anxiety and/or Agitation Depression:   Anhedonia Comorbid alcohol abuse/dependence Hopelessness Impulsivity Insomnia Recent sense of peace/wellbeing Severe Unstable or Poor Therapeutic Relationship Previous Psychiatric Diagnoses and Treatments  Psychiatric Specialty Exam: Physical Exam  Constitutional: She is oriented to person, place, and time. She appears well-developed.  HENT:  Head: Normocephalic.  Eyes: Pupils are equal, round, and reactive to light.  Neck: Normal range of motion.  Cardiovascular: Normal rate.   Respiratory: Effort normal.  GI: Soft.  Musculoskeletal: Normal range of motion.  Neurological: She is alert and oriented to person, place, and time.  Skin: Skin is warm.    Review of Systems  Psychiatric/Behavioral: Positive for depression and suicidal ideas. The patient is nervous/anxious.   All other systems reviewed and are negative.    Last menstrual period 11/21/2011.There is no weight on file to calculate BMI.  General Appearance: Disheveled and Guarded  Eye SolicitorContact::  Fair  Speech:  Clear and Coherent  Volume:  Normal  Mood:  Anxious, Depressed, Hopeless and Worthless  Affect:  Depressed and Flat  Thought Process:  Goal Directed and Intact  Orientation:  Full (Time, Place, and Person)  Thought Content:  Rumination  Suicidal Thoughts:  Yes.  with intent/plan  Homicidal Thoughts:  No  Memory:  Immediate;   Fair  Judgement:  Impaired  Insight:  Lacking  Psychomotor Activity:  Psychomotor Retardation  Concentration:  Fair  Recall:  Fair  Fund of Knowledge:Good  Language: Good  Akathisia:  NA  Handed:  Right  AIMS (if indicated):     Assets:  Communication Skills Desire for Improvement Financial  Resources/Insurance Housing Leisure Time Physical Health Resilience Social Support Transportation Vocational/Educational  Sleep:      Musculoskeletal: Strength & Muscle Tone: within normal limits Gait & Station: normal Patient leans: N/A  COGNITIVE FEATURES THAT CONTRIBUTE TO RISK:  Closed-mindedness Loss of executive function Polarized thinking Thought constriction (tunnel vision)    SUICIDE RISK:   Moderate:  Frequent suicidal ideation with limited intensity, and duration, some specificity in terms of plans, no associated intent, good self-control, limited dysphoria/symptomatology, some risk factors present, and identifiable protective factors, including available and accessible social support.  PLAN OF CARE: Admit for crisis stabilization, safety monitoring and medication management.   I certify that inpatient services furnished can reasonably be expected to improve the patient's condition.  ,JANARDHAHA R. 04/09/2013, 10:53 AM

## 2013-04-09 NOTE — ED Notes (Signed)
[facility],  Belongings sent w/ m[facility]t.

## 2013-04-09 NOTE — [facility] Group Notes (Signed)
[facility] LCSW Group Therapy  Living A Balanced Life  1:15 - 2: 30          04/09/2013 2:37 PM    Type of Therapy:  Group Therapy  Participation Level:  Appropriate  Participation Quality:  Appropriate  Affect:  Appropriate  Cognitive:  Attentive Appropriate  Insight: Developing/Improving  Engagement in Therapy:  Developing/Improving  Modes of Intervention:  Discussion Exploration Problem-Solving Supportive   Summary of Progress/Problems: Topic for group was Living a Balanced Life.  Patient was able to show how life has become unbalanced.  She shared her life is out of balance due to her focus being on school and working.  She also shared she had given up spending time with friends while she was in a relationship.   Patient able to  Identify appropriate coping skills including reconnecting with old friends.   Wynn BankerHodnett,  [place] 04/09/2013  2:37 PM

## 2013-04-09 NOTE — Progress Notes (Signed)
Admission Note  [facility] Patient appropriate and cooperative with staff. She presents with anxious mood and affect. Patient verbalized that she OD on Ambien after reading a message left on facebook from her ex-girlfriend. She voiced as well that she and the ex-girlfriend recently broke up and was told to get over it and listen to your friends.  A: Support and encouragement provided to patient. Oriented patient to the unit and informed of the rules/policies. Initiated Q15 minute checks for safety.  R: Patient receptive. Denies SI/HI and auditory/visual hallucinations. Patient remains safe on the unit.

## 2013-04-09 NOTE — ED Notes (Addendum)
[facility] still not transporting due to road conditions, [facility]   [place] charge nurse contacted, report update, OK to transport.

## 2013-04-09 NOTE — [facility] Suicide Risk Assessment (Signed)
[facility] INPATIENT:  Family/Significant Other Suicide Prevention Education  Suicide Prevention Education:  Education Completed; Estanislado SpireKaren Fuelling, Mother, [phone]; has been identified by the patient as the family member/significant other with whom the patient will be residing, and identified as the person(s) who will aid the patient in the event of a mental health crisis (suicidal ideations/suicide attempt).  With written consent from the patient, the family member/significant other has been provided the following suicide prevention education, prior to the and/or following the discharge of the patient.  The suicide prevention education provided includes the following:  Suicide risk factors  Suicide prevention and interventions  National Suicide Hotline telephone number  [facility] assessment telephone number  [facility] Emergency Assistance 911  [facility] and/or Residential Mobile Crisis Unit telephone number  Request made of family/significant other to:  Remove weapons (e.g., guns, rifles, knives), all items previously/currently identified as safety concern.     Mother advised patient does not have access to guns.  Remove drugs/medications (over-the-counter, prescriptions, illicit drugs), all items previously/currently identified as a safety concern.  The family member/significant other verbalizes understanding of the suicide prevention education information provided.  The family member/significant other agrees to remove the items of safety concern listed above.  Wynn BankerHodnett,  [place] 04/09/2013, 10:07 AM

## 2013-04-09 NOTE — [facility] Counselor (Signed)
Adult Comprehensive Assessment  Patient ID: Kitrina Maurin, female   DOB: 10-Nov-1990, 23 y.o.   MRN: [id number]  Information Source: Information source: Patient  Current Stressors:  Educational / Learning stressors: [employer] - Not on top of her work due to relationship problems Employment / Job issues: None -  Family Relationships: None Surveyor, quantity / Lack of resources (include bankruptcy): None Housing / Lack of housing: None Physical health (include injuries & life threatening diseases): None Social relationships: Does not like crowds Substance abuse: None Bereavement / Loss: None  Living/Environment/Situation:  Living conditions (as described by patient or guardian): Good How long has patient lived in current situation?: August 2013 What is atmosphere in current home: Comfortable;Supportive  Family History:  Marital status: Long term relationship What types of issues is patient dealing with in the relationship?: Recent breakup with partner - having difficulty accepting the breakup Does patient have children?: No  Childhood History:  By whom was/is the patient raised?: Both parents Additional childhood history information: Difficult childhood - Did not follow parents instruction Description of patient's relationship with caregiver when they were a child: Not good Patient's description of current relationship with people who raised him/her: Good relationship Does patient have siblings?: Yes Number of Siblings: 1 Description of patient's current relationship with siblings: Good relationship Did patient suffer any verbal/emotional/physical/sexual abuse as a child?: No Did patient suffer from severe childhood neglect?: No Has patient ever been sexually abused/assaulted/raped as an adolescent or adult?: No Was the patient ever a victim of a crime or a disaster?: No Witnessed domestic violence?: No  Education:  Learning disability?: Yes What learning problems does patient have?: Auditory  Reccesiive Processing   Employment/Work Situation:   Employment situation: Employed 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 How long has patient been employed?: January 2015 Patient's job has been impacted by current illness: No What is the longest time patient has a held a job?: Four years Where was the patient employed at that time?: [employer] Has patient ever been in the [employer]?: No Has patient ever served in Buyer, retail?: No  Financial Resources:   Surveyor, quantity resources: Income from employment;Support from parents / caregiver Does patient have a representative payee or guardian?: No  Alcohol/Substance Abuse:   What has been your use of drugs/alcohol within the last 12 months?: Patient denies If attempted suicide, did drugs/alcohol play a role in this?: No Alcohol/Substance Abuse Treatment Hx: Denies past history Has alcohol/substance abuse ever caused legal problems?: No  Social Support System:   Forensic psychologist System: None Describe Community Support System: N/A Type of faith/religion: None How does patient's faith help to cope with current illness?: N/A  Leisure/Recreation:   Leisure and Hobbies: Draw, listen to music and play games  Strengths/Needs:   What things does the patient do well?: Good at riding horses In what areas does patient struggle / problems for patient: School due to learning disability  Discharge Plan:   Does patient have access to transportation?: Yes Will patient be returning to same living situation after discharge?: Yes Currently receiving community mental health services: Yes (From Whom) If no, would patient like referral for services when discharged?: No Does patient have financial barriers related to discharge medications?: No  Summary/Recommendations:  Dorraine Ellender is a 23 year old female student at [employer] admitted with Major Depression Disorder following a suicide attempt by overdosing.  She will benefit from crisis  stabilization, evaluation for medication, psycho-education groups for coping skills development, group therapy and case management for  discharge planning.     , Joesph JulyQuylle Hairston. 04/09/2013

## 2013-04-09 NOTE — Tx Team (Signed)
Initial Interdisciplinary Treatment Plan  PATIENT STRENGTHS: (choose at least two) Ability for insight Capable of independent living Communication skills General fund of knowledge Motivation for treatment/growth  PATIENT STRESSORS: Educational concerns Marital or family conflict   PROBLEM LIST: Problem List/Patient Goals Date to be addressed Date deferred Reason deferred Estimated date of resolution  Depression      Anxiety                                                 DISCHARGE CRITERIA:  Ability to meet basic life and health needs Improved stabilization in mood, thinking, and/or behavior Motivation to continue treatment in a less acute level of care  PRELIMINARY DISCHARGE PLAN: Attend PHP/IOP Return to previous living arrangement Return to previous work or school arrangements  PATIENT/FAMIILY INVOLVEMENT: This treatment plan has been presented to and reviewed with the patient, Rebecca Clay.  The patient and family have been given the opportunity to ask questions and make suggestions.  Harold BarbanByrd,  E 04/09/2013, 10:49 AM

## 2013-04-10 NOTE — Progress Notes (Signed)
[facility] MD Progress Note  04/10/2013 12:26 PM Jisela Merlino  MRN:  [id number] Subjective:  Patient was seen and chart reviewed. Patient was admitted with the diagnosis of major depressive disorder, recurrent, severe without psychosis after made a suicide letter and by overdosing her medication Ambien and reportedly she a broke up with her relationship of 2 months.  Patient continued to be depressed anxious and worried about breakup with her relationship. She has been compliant with her medication management and group therapies without any difficulties. Vision has been learning coping skills to deal with her loss, depression and anxiety. She rates anxiety at 4/10 and minimizes depression 3/10. She has been feeling regrets about her suicidal behavior. She has denied HI and AVH, but affirms SI with thoughts of choking/cutting although transient. She was able to contract for safety while in hospital. Patient is in agreement with medication and treatment plan at this time.   Diagnosis:   DSM5: Schizophrenia Disorders:   Obsessive-Compulsive Disorders:   Trauma-Stressor Disorders:   Substance/Addictive Disorders:   Depressive Disorders:  Major Depressive Disorder - Severe (296.23) Total Time spent with patient: 30 minutes  Axis I: Major Depression, Recurrent severe  ADL's:  Intact  Sleep: Fair  Appetite:  Fair  Suicidal Ideation:  Patient has been contracting for safety while in the hospital  Homicidal Ideation:  Denied  AEB (as evidenced by):  Psychiatric Specialty Exam: Physical Exam  ROS  Blood pressure 126/90, pulse 88, temperature 97.4 F (36.3 C), temperature source Oral, resp. rate 16, height _0  (1.727 m), weight 65.318 kg (144 lb), last menstrual period 11/21/2011, SpO2 97.00%.Body mass index is 21.9 kg/(m^2).  General Appearance: Guarded  Eye Contact::  Fair  Speech:  Clear and Coherent  Volume:  Normal  Mood:  Anxious, Depressed, Hopeless and Worthless  Affect:  Depressed and  Flat  Thought Process:  Goal Directed and Intact  Orientation:  Full (Time, Place, and Person)  Thought Content:  WDL  Suicidal Thoughts:  Yes.  with intent/plan  Homicidal Thoughts:  No  Memory:  Immediate;   Good  Judgement:  Fair  Insight:  Fair  Psychomotor Activity:  Psychomotor Retardation  Concentration:  Fair  Recall:  Mayo of Knowledge:Fair  Language: Good  Akathisia:  NA  Handed:  Right  AIMS (if indicated):     Assets:  Communication Skills Desire for Improvement Financial Resources/Insurance [place] Talents/Skills Transportation  Sleep:  Number of Hours: 5.25   Musculoskeletal: Strength & Muscle Tone: within normal limits Gait & Station: normal Patient leans: N/A  Current Medications: Current Facility-Administered Medications  Medication Dose Route Frequency Provider Last Rate Last Dose  . acetaminophen (TYLENOL) tablet 650 mg  650 mg Oral Q6H PRN Durward Parcel, MD      . alum & mag hydroxide-simeth (MAALOX/MYLANTA) 200-200-20 MG/5ML suspension 30 mL  30 mL Oral Q4H PRN Durward Parcel, MD      . escitalopram (LEXAPRO) tablet 20 mg  20 mg Oral Daily Durward Parcel, MD   20 mg at 04/10/13 0712  . hydrOXYzine (ATARAX/VISTARIL) tablet 25 mg  25 mg Oral Q6H PRN Benjamine Mola, FNP      . magnesium hydroxide (MILK OF MAGNESIA) suspension 30 mL  30 mL Oral Daily PRN Durward Parcel, MD      . traZODone (DESYREL) tablet 50 mg  50 mg Oral QHS PRN Durward Parcel, MD        Lab Results:  Results for orders placed during the hospital encounter of 04/08/13 (from the past 48 hour(s))  CBC     Status: None   Collection Time    04/08/13  3:10 PM      Result Value Ref Range   WBC 8.3  4.0 - 10.5 K/uL   RBC 4.74  3.87 - 5.11 MIL/uL   Hemoglobin 14.6  12.0 - 15.0 g/dL   HCT 42.3  36.0 - 46.0 %   MCV 89.2  78.0 - 100.0 fL   MCH 30.8  26.0 - 34.0 pg   MCHC 34.5  30.0 -  36.0 g/dL   RDW 12.3  11.5 - 15.5 %   Platelets 214  150 - 400 K/uL  COMPREHENSIVE METABOLIC PANEL     Status: Abnormal   Collection Time    04/08/13  3:10 PM      Result Value Ref Range   Sodium 138  137 - 147 mEq/L   Potassium 4.3  3.7 - 5.3 mEq/L   Chloride 101  96 - 112 mEq/L   CO2 23  19 - 32 mEq/L   Glucose, Bld 93  70 - 99 mg/dL   BUN 6  6 - 23 mg/dL   Creatinine, Ser 0.69  0.50 - 1.10 mg/dL   Calcium 9.6  8.4 - 10.5 mg/dL   Total Protein 7.9  6.0 - 8.3 g/dL   Albumin 4.2  3.5 - 5.2 g/dL   AST 18  0 - 37 U/L   ALT 11  0 - 35 U/L   Alkaline Phosphatase 68  39 - 117 U/L   Total Bilirubin 1.8 (*) 0.3 - 1.2 mg/dL   GFR calc non Af Amer >90  >90 mL/min   GFR calc Af Amer >90  >90 mL/min   Comment: (NOTE)     The eGFR has been calculated using the CKD EPI equation.     This calculation has not been validated in all clinical situations.     eGFR's persistently <90 mL/min signify possible Chronic Kidney     Disease.  ETHANOL     Status: None   Collection Time    04/08/13  3:10 PM      Result Value Ref Range   Alcohol, Ethyl (B) <11  0 - 11 mg/dL   Comment:            LOWEST DETECTABLE LIMIT FOR     SERUM ALCOHOL IS 11 mg/dL     FOR MEDICAL PURPOSES ONLY  ACETAMINOPHEN LEVEL     Status: None   Collection Time    04/08/13  3:10 PM      Result Value Ref Range   Acetaminophen (Tylenol), Serum <15.0  10 - 30 ug/mL   Comment:            THERAPEUTIC CONCENTRATIONS VARY     SIGNIFICANTLY. A RANGE OF 10-30     ug/mL MAY BE AN EFFECTIVE     CONCENTRATION FOR MANY PATIENTS.     HOWEVER, SOME ARE BEST TREATED     AT CONCENTRATIONS OUTSIDE THIS     RANGE.     ACETAMINOPHEN CONCENTRATIONS     >150 ug/mL AT 4 HOURS AFTER     INGESTION AND >50 ug/mL AT 12     HOURS AFTER INGESTION ARE     OFTEN ASSOCIATED WITH TOXIC     REACTIONS.  SALICYLATE LEVEL     Status: Abnormal   Collection Time    04/08/13  3:10 PM      Result Value Ref Range   Salicylate Lvl <7.2 (*) 2.8 - 20.0  mg/dL  URINE RAPID DRUG SCREEN (HOSP PERFORMED)     Status: None   Collection Time    04/08/13  3:46 PM      Result Value Ref Range   Opiates NONE DETECTED  NONE DETECTED   Cocaine NONE DETECTED  NONE DETECTED   Benzodiazepines NONE DETECTED  NONE DETECTED   Amphetamines NONE DETECTED  NONE DETECTED   Tetrahydrocannabinol NONE DETECTED  NONE DETECTED   Barbiturates NONE DETECTED  NONE DETECTED   Comment:            DRUG SCREEN FOR MEDICAL PURPOSES     ONLY.  IF CONFIRMATION IS NEEDED     FOR ANY PURPOSE, NOTIFY LAB     WITHIN 5 DAYS.                LOWEST DETECTABLE LIMITS     FOR URINE DRUG SCREEN     Drug Class       Cutoff (ng/mL)     Amphetamine      1000     Barbiturate      200     Benzodiazepine   094     Tricyclics       709     Opiates          300     Cocaine          300     THC              50  URINALYSIS, ROUTINE W REFLEX MICROSCOPIC     Status: Abnormal   Collection Time    04/08/13  3:46 PM      Result Value Ref Range   Color, Urine YELLOW  YELLOW   APPearance TURBID (*) CLEAR   Specific Gravity, Urine 1.020  1.005 - 1.030   pH 8.0  5.0 - 8.0   Glucose, UA NEGATIVE  NEGATIVE mg/dL   Hgb urine dipstick NEGATIVE  NEGATIVE   Bilirubin Urine NEGATIVE  NEGATIVE   Ketones, ur NEGATIVE  NEGATIVE mg/dL   Protein, ur NEGATIVE  NEGATIVE mg/dL   Urobilinogen, UA 0.2  0.0 - 1.0 mg/dL   Nitrite NEGATIVE  NEGATIVE   Leukocytes, UA SMALL (*) NEGATIVE  URINE MICROSCOPIC-ADD ON     Status: Abnormal   Collection Time    04/08/13  3:46 PM      Result Value Ref Range   Squamous Epithelial / LPF FEW (*) RARE   WBC, UA 0-2  <3 WBC/hpf   RBC / HPF 3-6  <3 RBC/hpf   Bacteria, UA FEW (*) RARE   Urine-Other MUCOUS PRESENT     Comment: AMORPHOUS URATES/PHOSPHATES  POC URINE PREG, ED     Status: None   Collection Time    04/08/13  3:52 PM      Result Value Ref Range   Preg Test, Ur NEGATIVE  NEGATIVE   Comment:            THE SENSITIVITY OF THIS     METHODOLOGY IS >24  mIU/mL    Physical Findings: AIMS:  , ,  ,  ,    CIWA:    COWS:     Treatment Plan Summary: Daily contact with patient to assess and evaluate symptoms and progress in treatment Medication management  Plan: Treatment Plan/Recommendations:   1. Admit for crisis management and  stabilization. 2. Medication management to reduce current symptoms to base line and improve the patient's overall level of functioning. Continue Lexapro 20 mg daily for depression and anxiety and trazodone 50 mg as needed for insomnia  3. Treat health problems as indicated. 4. Develop treatment plan to decrease risk of relapse upon discharge and to reduce the need for readmission. 5. Psycho-social education regarding relapse prevention and self care. 6. Health care follow up as needed for medical problems. 7. Restart home medications where appropriate. 8. Disposition plans are in progress and case manager will contact his mother regarding disposition plans   Medical Decision Making Problem Points:  Established problem, worsening (2), New problem, with no additional work-up planned (3), Review of last therapy session (1) and Review of psycho-social stressors (1) Data Points:  Review or order clinical lab tests (1) Review or order medicine tests (1) Review of medication regiment & side effects (2) Review of new medications or change in dosage (2)  I certify that inpatient services furnished can reasonably be expected to improve the patient's condition.   ,JANARDHAHA R. 04/10/2013, 12:26 PM

## 2013-04-10 NOTE — [facility] Group Notes (Signed)
[facility] LCSW Group Therapy  Feelings Around Relapse 1:15 -2:30        04/10/2013   Type of Therapy:  Group Therapy  Participation Level:  Appropriate  Participation Quality:  Appropriate  Affect:  Appropriate  Cognitive:  Attentive Appropriate  Insight:  Developing/Improving  Engagement in Therapy: Developing/Improving  Modes of Intervention:  Discussion Exploration Problem-Solving Supportive  Summary of Progress/Problems:  The topic for today was feelings around relapse.    Patient processed feelings toward relapse and was able to relate to peers.  Patient shared relapsing would be to go to the club where she knows ex-girlfriend will be in hopes of getting back together.   Patient identified coping skills that can be used to prevent a relapse.   Wynn BankerHodnett,  Hairston 04/10/2013

## 2013-04-10 NOTE — Progress Notes (Signed)
Patient ID: Rebecca Clay, female   DOB: 12/07/1990, 22 y.o.   MRN: [id number] D: Patient stated she is doing well but has a headache from not wearing her glasses. Pt mood and affect appeared anxious. Pt denies suicidal /homicidal ideation intent and plan. Pt denies auditory and visual hallucination. Pt attended evening wrap up group and engaged in discussion. Pt denies any needs or concerns. Cooperative with assessment. No acute distressed noted at this time.   A: Met with pt 1:1. Medication administered as ordered. Writer encouraged pt to discuss feelings. Pt encouraged to come to staff with any questions or concerns.   R: Patient is safe on the unit. Continue current POC.     

## 2013-04-10 NOTE — Tx Team (Signed)
Interdisciplinary Treatment Plan Update   Date Reviewed:  04/10/2013  Time Reviewed:  9:52 AM  Progress in Treatment:   Attending groups: Yes Participating in groups: Yes Taking medication as prescribed: Yes  Tolerating medication: Yes Family/Significant other contact made:  Yes,  collateral contact with mother. Patient understands diagnosis: Yes  Discussing patient identified problems/goals with staff: Yes Medical problems stabilized or resolved: Yes Denies suicidal/homicidal ideation: Yes Patient has not harmed self or others: Yes  For review of initial/current patient goals, please see plan of care.  Estimated Length of Stay:  2-3 days   Reasons for Continued Hospitalization:  Anxiety Depression Medication stabilization  New Problems/Goals identified:    Discharge Plan or Barriers:   [facility]  Additional Comments:  Rebecca Clay is an 23 y.o. female. [facility] after intentional overdose on an unknown amount of her ambien sleeping pills. Patient reports that she recently broke up with her partner on 02-23-13. Patient reports that her ex-partner sent a message to her on Facebook several days ago telling patient that she wanted to "fuck" her,this was clarified as patient's ex-partner wanting patient to come over and have sex with her. Patient reports that she broke down and began crying. Patient states that she felt that she could no longer continue in the relationship with the perks to include sex, kissing as patient describes the perks of the relationship as a "fun bubble". Pt reports that she did not want to continue in the relationship without a commitment.   Attendees:  Patient:  04/10/2013 9:52 AM   Signature: Mervyn GayJ. Jonnalagadda, MD 04/10/2013 9:52 AM  Signature:   Silverio DecampJamison Lloyd, NP  04/10/2013 9:52 AM  Signature:   04/10/2013 9:52 AM  Signature: 04/10/2013 9:52 AM   Signature:   04/10/2013 9:52 AM  Signature:  Juline PatchQuylle , LCSW 04/10/2013 9:52 AM  Signature:  Reyes Ivanhelsea Horton, LCSW 04/10/2013 9:52 AM  Signature:  04/10/2013 9:52 AM  Signature:   04/10/2013 9:52 AM  Signature: Leighton ParodyBritney Tyson, RN 04/10/2013  9:52 AM  Signature:   Onnie BoerJennifer Clark, RN [facility] 04/10/2013  9:52 AM  Signature:  Harold Barbanonecia Byrd, RN 04/10/2013  9:52 AM    Scribe for Treatment Team:   Juline PatchQuylle ,  04/10/2013 9:52 AM

## 2013-04-10 NOTE — Progress Notes (Signed)
Patient ID: Rebecca Clay, female   DOB: 04-16-1990, 23 y.o.   MRN: [id number] D: Patient stated she is doing well. Pt mood and affect appeared anxious. Pt rates depression and anxiety as 3 on a 0-10 scale. Pt denies suicidal /homicidal ideation intent and plan. Pt denies auditory and visual hallucination. Pt attended evening wrap up group and engaged in discussion. Pt denies any needs or concerns. Cooperative with assessment. No acute distressed noted at this time.   A: Met with pt 1:1.  Writer encouraged pt to discuss feelings. Pt encouraged to come to staff with any questions or concerns.   R: Patient is safe on the unit. Continue current POC.

## 2013-04-10 NOTE — Progress Notes (Signed)
D: Patient cooperative with staff and peers. Patient's affect is appropriate to circumstance and mood is anxious. She reported on the self inventory sheet that she's sleeping fair, appetite and ability to pay attention are both good and energy level is normal. Patient rated depression and feelings of hopelessness "2". She's participating in groups, visible in the milieu and interactive with peers in the dayroom. Patient adheres to current medication regimen.  A: Support and encouragement provided to patient. Scheduled medications administered per MD orders. Maintain Q15 minute checks for safety.  R: Patient receptive. Denies SI/HI and auditory/visual hallucinations. Patient remains safe.

## 2013-04-10 NOTE — [facility] Group Notes (Signed)
[facility]   04/10/2013 1:11 PM    Participation Quality:  Appropraite  Mood/Affect:  Appropriate  Depression Rating:  2  Anxiety Rating:  3  Thoughts of Suicide:  No  Will you contract for safety?   NA  Current AVH:  No  Plan for Discharge/Comments:  Patient attended discharge planning group and actively participated in group.  She will follow up with Maxwell MarionHeather McCain and [facility].  CSW provided all participants with daily workbook.   Transportation Means: Patient has transportation.   Supports:  Patient has a support system.   , Joesph JulyQuylle Hairston

## 2013-04-10 NOTE — Progress Notes (Signed)
Recreation Therapy Notes  Date: 02.27.2015 Time: 2:45pm Location: [address] Dayroom    Group Topic: Communication, Team Building, Problem Solving  Goal Area(s) Addresses:  Patient will effectively work with peer towards shared goal.  Patient will identify skill used to make activity successful.  Patient will identify how skills used during activity can be used to build healthy support system.   Behavioral Response: Appropriate   Intervention: Problem Solving Activity  Activity: [place] [employer]. Patients were provided the following materials: 5 drinking straws, 5 rubber bands, 5 paper clips, 2 index cards, 2 drinking cups, and 2 toilet paper rolls. Using the provided materials patients were asked to build a launching mechanisms to launch a ping pong ball approximately 12 feet. Patients were divided into teams of 3-5.   Education: Pharmacist, communityocial Skills, Building control surveyorDischarge Planning.   Education Outcome: Acknowledges understanding   Clinical Observations/Feedback: Patient actively engaged in group activity, working well with team mates and assisting with Holiday representativeconstruction of teams launching mechanism. Patient made no contributions to group discussion, but appeared to actively listen as she maintained appropriate eye contact with speaker.   Marykay Lexenise L , LRT/CTRS  ,  L 04/10/2013 4:14 PM

## 2013-04-10 NOTE — Progress Notes (Signed)
[facility] Group Notes:  (Nursing/MHT/Case Management/Adjunct)  Date:  04/10/2013  Time:  8:00 p.m.   Type of Therapy:  Psychoeducational Skills  Participation Level:  Active  Participation Quality:  Appropriate  Affect:  Appropriate  Cognitive:  Appropriate  Insight:  Appropriate  Engagement in Group:  Developing/Improving  Modes of Intervention:  Education  Summary of Progress/Problems: The patient described her day as having been okay overall. She states that she attended all of her groups and called her sister. As a theme for the day, her relapse prevention shall include having her female roommate monitor her behavior more closely and speak up if he notices any changes.   Hazle CocaGOODMAN,  S 04/10/2013, 10:33 PM

## 2013-04-11 MED ORDER — BACITRACIN-NEOMYCIN-POLYMYXIN 400-5-5000 EX OINT
TOPICAL_OINTMENT | Freq: Two times a day (BID) | CUTANEOUS | Status: DC
  Administered 2013-04-11 – 2013-04-13 (×3): 1 via TOPICAL
  Filled 2013-04-11 (×3): qty 1

## 2013-04-11 NOTE — Progress Notes (Signed)
D) Pt has attended the groups and participates in them. Affect is flat and mood depressed. States "I am trying to get over my X. I think about her all the time and it is just so hard". Pt rates her depression and her hopelessness both at a 2. Denies SI and HI. States that she wants to be able to deal with her feelings of abandonment while she is here. A) Given support and reassurance. Encouraged to work on her booklet and to attend all the groups.  R) Denies SI and HI presently.

## 2013-04-11 NOTE — Progress Notes (Signed)
Patient ID: Rebecca Clay, female   DOB: 1990-07-05, 23 y.o.   MR[facility] [id number] [facility]  04/11/2013 4:54 PM Rebecca Clay  MRN:  [id number] Assessment:  Patient verbalizes continued feelings of depression, states better today than yesterday SI without plan.  States she felt overwhelmed following break up with significant other.  Complains of itching to scratches on left dorsal forearm.  Patient has a series of superficial scratches that appear to be healing without drainage.   Verbalizes that she feels safe on the unit and is noted to be participating in all unit groups and program.    Diagnosis:   DSM5:   Depressive Disorders:  Major Depressive Disorder - Severe (296.23) Total Time spent with patient: 30 minutes  Axis I: Major Depression, Recurrent severe   ADL's:  Intact  Sleep: Fair  Appetite:  Fair  Suicidal Ideation:  Patient has been contracting for safety while in the hospital; SI without plan   Homicidal Ideation:  Denied  AEB (as evidenced by):  Psychiatric Specialty Exam: Physical Exam  Review of Systems  Constitutional: Negative.   HENT: Negative.   Eyes: Negative.   Respiratory: Negative.   Cardiovascular: Negative.   Gastrointestinal: Negative.   Genitourinary: Negative.   Musculoskeletal: Negative.   Skin: Positive for itching.  Neurological: Negative.   Endo/Heme/Allergies: Negative.   Psychiatric/Behavioral: Positive for depression and suicidal ideas. The patient is nervous/anxious.     Blood pressure 116/72, pulse 103, temperature 97.3 F (36.3 C), temperature source Oral, resp. rate 16, height 5\' 8"  (1.727 m), weight 65.318 kg (144 lb), last menstrual period 11/21/2011, SpO2 97.00%.Body mass index is 21.9 kg/(m^2).  General Appearance: pleasant cooperative, appears stated age   Eye Contact::  Fair  Speech: normal rate rhythm and prosody   Volume:  Normal  Mood:  Anxious, Depressed, Hopeless and Worthless  Affect:   Depressed   Thought  Process:  Logical goal directed denies ruminations, blocking   Orientation:  Full (Time, Place, and Person)  Thought Content:  WDL Discusses need to develop coping skills and plan for cutting ties with significant other   Suicidal Thoughts:  Yes denies current  Pan   Homicidal Thoughts:  No  Memory:  Immediate;   Good  Judgement:  Fair  Insight:  Fair  Psychomotor Activity:  Psychomotor Retardation  Concentration:  Fair  Recall:  Fair  Fund of Knowledge:Fair  Language: Good  Akathisia:  NA  Handed:  Right  AIMS (if indicated):     Assets:  Communication Skills Desire for Improvement Financial Resources/Insurance Housing Physical Health Resilience Social Support Talents/Skills Transportation  Sleep:  Number of Hours: 6.25   Musculoskeletal: Strength & Muscle Tone: within normal limits Gait & Station: normal Patient leans: N/A  Current Medications: Current Facility-Administered Medications  Medication Dose Route Frequency Provider Last Rate Last Dose  . acetaminophen (TYLENOL) tablet 650 mg  650 mg Oral Q6H PRN Nehemiah SettleJanardhaha R Jonnalagadda, MD   650 mg at 04/10/13 2049  . alum & mag hydroxide-simeth (MAALOX/MYLANTA) 200-200-20 MG/5ML suspension 30 mL  30 mL Oral Q4H PRN Nehemiah SettleJanardhaha R Jonnalagadda, MD      . escitalopram (LEXAPRO) tablet 20 mg  20 mg Oral Daily Nehemiah SettleJanardhaha R Jonnalagadda, MD   20 mg at 04/11/13 0805  . hydrOXYzine (ATARAX/VISTARIL) tablet 25 mg  25 mg Oral Q6H PRN Beau FannyJohn C Withrow, FNP      . magnesium hydroxide (MILK OF MAGNESIA) suspension 30 mL  30 mL Oral Daily PRN Nehemiah SettleJanardhaha R Jonnalagadda,  MD      . traZODone (DESYREL) tablet 50 mg  50 mg Oral QHS PRN Nehemiah Settle, MD        Lab Results:  No results found for this or any previous visit (from the past 48 hour(s)).  Physical Findings: AIMS: Facial and Oral Movements Muscles of Facial Expression: None, normal Lips and Perioral Area: None, normal Jaw: None, normal Tongue: None, normal,Extremity  Movements Upper (arms, wrists, hands, fingers): None, normal Lower (legs, knees, ankles, toes): None, normal, Trunk Movements Neck, shoulders, hips: None, normal, Overall Severity Severity of abnormal movements (highest score from questions above): None, normal Incapacitation due to abnormal movements: None, normal Patient's awareness of abnormal movements (rate only patient's report): No Awareness, Dental Status Current problems with teeth and/or dentures?: No Does patient usually wear dentures?: No  CIWA:    COWS:     Treatment Plan Summary: Daily contact with patient to assess and evaluate symptoms and progress in treatment Medication management  Continue with current plan   Plan: Treatment Plan/Recommendations:   1. Admit for crisis management and stabilization. 2. Medication management to reduce current symptoms to base line and improve the patient's overall level of functioning. Continue Lexapro 20 mg daily for depression and anxiety and trazodone 50 mg as needed for insomnia  3. Treat health problems as indicated. 4. Develop treatment plan to decrease risk of relapse upon discharge and to reduce the need for readmission. 5. Psycho-social education regarding relapse prevention and self care. 6. Health care follow up as needed for medical problems. 7. Restart home medications where appropriate. 8. Disposition plans are in progress and case manager will contact his mother regarding disposition plans   Medical Decision Making Problem Points:  Established problem, worsening (2), New problem, with no additional work-up planned (3), Review of last therapy session (1) and Review of psycho-social stressors (1) Data Points:  Review or order clinical lab tests (1) Review or order medicine tests (1) Review of medication regiment & side effects (2) Review of new medications or change in dosage (2)  I certify that inpatient services furnished can reasonably be expected to improve the  patient's condition.   Nanine Means, PMH-NP 04/11/2013, 4:54 PM

## 2013-04-11 NOTE — Progress Notes (Signed)
[facility] Group Notes:  (Nursing/MHT/Case Management/Adjunct)  Date:  04/11/2013  Time:  8:00 p.m.   Type of Therapy:  Psychoeducational Skills  Participation Level:  Active  Participation Quality:  Appropriate  Affect:  Appropriate  Cognitive:  Appropriate  Insight:  Good  Engagement in Group:  Engaged  Modes of Intervention:  Education  Summary of Progress/Problems: The patient expressed this evening that she initially felt very drowsy in the morning, but later on felt better. She also stated that she had a good visit with her mother and sister. Her affect brightened when she announced that her family brought in some clothing for her today. As a theme for the day, her coping skill will be listen to music.   Hazle CocaGOODMAN,  S 04/11/2013, 10:09 PM

## 2013-04-11 NOTE — [facility] Group Notes (Signed)
[facility] Group Notes:  (Clinical Social Work)  04/11/2013   1:15-2:15PM  Summary of Progress/Problems:   The main focus of today's process group was for the patient to identify ways in which they have sabotaged their own mental health wellness/recovery.  Motivational interviewing and a handout were used to explore the benefits and costs of their self-sabotaging behavior as well as the benefits and costs of changing this behavior.  The Stages of Change were explained to the group using a handout, and it was explained to patients the importance of making specific plans for how to handle self-sabotaging behaviors. The patient expressed she self-sabotages with being a people pleaser, as well as ruminating about things being perfect and feeling guilty when they are not.  She would like to not always focus on others so much, and to stop worrying about all things all the time.  She feels this would then make her relationships better and happier.  Type of Therapy:  Process Group  Participation Level:  Active  Participation Quality:  Attentive and Sharing  Affect:  Anxious and Blunted  Cognitive:  Appropriate and Oriented  Insight:  Improving  Engagement in Therapy:  Engaged  Modes of Intervention:  Education, Motivational Interviewing   Ambrose MantleMareida Grossman-Orr, LCSW 04/11/2013, 4:00pm

## 2013-04-12 MED ORDER — HYDROXYZINE HCL 25 MG PO TABS
25.0000 mg | ORAL_TABLET | Freq: Every day | ORAL | Status: DC
  Administered 2013-04-12: 25 mg via ORAL
  Filled 2013-04-12 (×4): qty 1

## 2013-04-12 NOTE — Progress Notes (Signed)
Psychoeducational Group Note  Date: 04/12/2013 Time:  0930  Group Topic/Focus:  Gratefulness:  The focus of this group is to help patients identify what two things they are most grateful for in their lives. What helps ground them and to center them on their work to their recovery.  Participation Level:  Active  Participation Quality:  Appropriate  Affect:  Appropriate  Cognitive:  Oriented  Insight:  improving  Engagement in Group:  Engaged  Additional Comments:    ,  A   

## 2013-04-12 NOTE — Progress Notes (Signed)
[facility] Group Notes:  (Nursing/MHT/Case Management/Adjunct)  Date:  04/12/2013  Time:  8:00 p.m.   Type of Therapy:  Psychoeducational Skills  Participation Level:  Active  Participation Quality:  Appropriate  Affect:  Appropriate  Cognitive:  Appropriate  Insight:  Appropriate  Engagement in Group:  Engaged  Modes of Intervention:  Education  Summary of Progress/Problems: The patient expressed in group that she felt very drowsy in the morning, but that dissipated in the afternoon. She also mentioned that she had another good visit with her parents today. As a theme for the day, her parents and sister will be her support system.   Hazle CocaGOODMAN,  S 04/12/2013, 11:54 PM

## 2013-04-12 NOTE — Progress Notes (Signed)
Patient has been up and active on the unit, attended group this evening and has voiced no complaints. Patient currently denies having pain, -si/hi/a/v hall. She reports that she will no longer be on facebook which should help her not to get caught up in her ex-partner again. She reports that her mother visited on this evening and is waiting for a book to be dropped off by her mom.  Support and encouragement offered, safety maintained on unit, will continue to monitor.

## 2013-04-12 NOTE — Progress Notes (Signed)
Patient ID: Rebecca Clay, female   DOB: 14-Nov-1990, 23 y.o.   MRN: [id number] [facility] MD Progress Note  04/12/2013 3:01 PM Rebecca Clay  MRN:  [id number] Assessment:  Patient stated she was glad she was alive and her suicide attempt did not work.  Her family visited last night and she was upset they were crying about her suicide attempt.  They encouraged her to call them in the future, supportive.  Tobi Bastos was smiling constantly during assessment inappropriately when discussing upset issues.  Her sleep and appetite are "good".  She requested her anxiety medication be moved to the pm--adjusted.  Diagnosis:   DSM5:   Depressive Disorders:  Major Depressive Disorder - Severe (296.23) Total Time spent with patient: 30 minutes  Axis I: Major Depression, Recurrent severe   ADL's:  Intact  Sleep: Fair  Appetite:  Fair  Suicidal Ideation:  Patient has been contracting for safety while in the hospital; SI without plan   Homicidal Ideation:  Denied  AEB (as evidenced by):  Psychiatric Specialty Exam: Physical Exam  Review of Systems  Constitutional: Negative.   HENT: Negative.   Eyes: Negative.   Respiratory: Negative.   Cardiovascular: Negative.   Gastrointestinal: Negative.   Genitourinary: Negative.   Musculoskeletal: Negative.   Skin: Positive for itching.  Neurological: Negative.   Endo/Heme/Allergies: Negative.   Psychiatric/Behavioral: Positive for depression and suicidal ideas. The patient is nervous/anxious.     Blood pressure 116/72, pulse 103, temperature 97.3 F (36.3 C), temperature source Oral, resp. rate 16, height 5\' 8"  (1.727 m), weight 65.318 kg (144 lb), last menstrual period 11/21/2011, SpO2 97.00%.Body mass index is 21.9 kg/(m^2).  General Appearance: pleasant cooperative, appears stated age   Eye Contact::  Fair  Speech: normal rate rhythm and prosody   Volume:  Normal  Mood:  Anxious, Depressed, Hopeless and Worthless  Affect:   Inappropriate  Thought Process:   Coherent  Orientation:  Full (Time, Place, and Person)  Thought Content:  WDL Discusses need to develop coping skills and plan for cutting ties with significant other   Suicidal Thoughts:  Yes denies current  plan  Homicidal Thoughts:  No  Memory:  Immediate;   Good  Judgement:  Fair  Insight:  Fair  Psychomotor Activity:  Normal  Concentration:  Fair  Recall:  Fair  Fund of Knowledge:Fair  Language: Good  Akathisia:  NA  Handed:  Right  AIMS (if indicated):     Assets:  Communication Skills Desire for Improvement Financial Resources/Insurance Housing Physical Health Resilience Social Support Talents/Skills Transportation  Sleep:  Number of Hours: 6.75   Musculoskeletal: Strength & Muscle Tone: within normal limits Gait & Station: normal Patient leans: N/A  Current Medications: Current Facility-Administered Medications  Medication Dose Route Frequency Provider Last Rate Last Dose  . acetaminophen (TYLENOL) tablet 650 mg  650 mg Oral Q6H PRN Nehemiah Settle, MD   650 mg at 04/10/13 2049  . alum & mag hydroxide-simeth (MAALOX/MYLANTA) 200-200-20 MG/5ML suspension 30 mL  30 mL Oral Q4H PRN Nehemiah Settle, MD      . escitalopram (LEXAPRO) tablet 20 mg  20 mg Oral Daily Nehemiah Settle, MD   20 mg at 04/12/13 0830  . hydrOXYzine (ATARAX/VISTARIL) tablet 25 mg  25 mg Oral Q6H PRN [name], FNP      . magnesium hydroxide (MILK OF MAGNESIA) suspension 30 mL  30 mL Oral Daily PRN Nehemiah Settle, MD      . neomycin-bacitracin-polymyxin (NEOSPORIN) ointment  Topical BID Nanine MeansJamison , NP   1 application at 04/12/13 [phone]  . traZODone (DESYREL) tablet 50 mg  50 mg Oral QHS PRN Nehemiah SettleJanardhaha R Jonnalagadda, MD        Lab Results:  No results found for this or any previous visit (from the past 48 hour(s)).  Physical Findings: AIMS: Facial and Oral Movements Muscles of Facial Expression: None, normal Lips and Perioral Area: None,  normal Jaw: None, normal Tongue: None, normal,Extremity Movements Upper (arms, wrists, hands, fingers): None, normal Lower (legs, knees, ankles, toes): None, normal, Trunk Movements Neck, shoulders, hips: None, normal, Overall Severity Severity of abnormal movements (highest score from questions above): None, normal Incapacitation due to abnormal movements: None, normal Patient's awareness of abnormal movements (rate only patient's report): No Awareness, Dental Status Current problems with teeth and/or dentures?: No Does patient usually wear dentures?: No  CIWA:    COWS:     Treatment Plan Summary: Daily contact with patient to assess and evaluate symptoms and progress in treatment Medication management  Continue with current plan   Plan: Treatment Plan/Recommendations:   1. Admit for crisis management and stabilization. 2. Medication management to reduce current symptoms to base line and improve the patient's overall level of functioning. Continue Lexapro 20 mg daily for depression and anxiety and trazodone 50 mg as needed for insomnia  3. Treat health problems as indicated. 4. Develop treatment plan to decrease risk of relapse upon discharge and to reduce the need for readmission. 5. Psycho-social education regarding relapse prevention and self care. 6. Health care follow up as needed for medical problems. 7. Restart home medications where appropriate. 8. Disposition plans are in progress and case manager will contact his mother regarding disposition plans  Medical Decision Making Problem Points:  Established problem, worsening (2), New problem, with no additional work-up planned (3), Review of last therapy session (1) and Review of psycho-social stressors (1) Data Points:  Review or order clinical lab tests (1) Review or order medicine tests (1) Review of medication regiment & side effects (2) Review of new medications or change in dosage (2)  I certify that inpatient services  furnished can reasonably be expected to improve the patient's condition.   Nanine MeansLORD, , PMH-NP 04/12/2013, 3:01 PM

## 2013-04-12 NOTE — Progress Notes (Signed)
D) Pt has been attending the groups and interacting with her peers. Pt rates her depression at a 1 one and her hopelessness at a 0. Continues to state that she needs to stay away from her X and not go to the night clubs that her X frequents and to not get on face book, where she and her X had talked on a lot. States she has friends that can help her when she leaves here. A) Given support, reassurance and praise. Given encouragement. R) Denies SI and HI.

## 2013-04-12 NOTE — [facility] Group Notes (Signed)
[facility] Group Notes:  (Clinical Social Work)  04/12/2013   1:15-2:15PM  Summary of Progress/Problems:  The main focus of today's process group was to   identify the patient's current support system and decide on other supports that can be put in place.  The picture on workbook was used to discuss why additional supports are needed.  An emphasis was placed on using counselor, doctor, therapy groups, 12-step groups, and problem-specific support groups to expand supports.   There was also an extensive discussion about what constitutes a healthy support versus an unhealthy support.  The patient expressed full comprehension of the concepts presented, and agreed that there is a need to add more supports.  The patient stated that if people ask her to share her burdens with them, or tell her what is wrong, she pretends like there is nothing wrong.  Type of Therapy:  Process Group  Participation Level:  Active  Participation Quality:  Attentive and Sharing  Affect:  Blunted and Incongruent  Cognitive:  Appropriate and Oriented  Insight:  Engaged  Engagement in Therapy:  Engaged  Modes of Intervention:  Education,  Support and Con[employer]Processing   Grossman-Orr, LCSW 04/12/2013, 4:00pm

## 2013-04-12 NOTE — Progress Notes (Signed)
Patient has been up and active on the unit, attended group this evening and has voiced no complaints. She is hopeful to discharge on tomorrow. She plans to delete her ex from facebook and since its lent season see reports that she will not be going to the club and she won't see her for a while during this time which she hopes will be helpful also. Patient currently denies having pain, -si/hi/a/v hall. Support and encouragement given, safety maintained on unit, will continue to monitor.

## 2013-04-12 NOTE — Progress Notes (Signed)
Late entry for 04-11-13 Entered on 04-12-13  Psychoeducational Group Note  Date: 04/12/2013 Time:  1015  Group Topic/Focus:  Identifying Needs:   The focus of this group is to help patients identify their personal needs that have been historically problematic and identify healthy behaviors to address their needs.  Participation Level:  Active  Participation Quality:  Appropriate  Affect:  Appropriate  Cognitive:  Oriented  Insight:  Improving  Engagement in Group:  Engaged  Additional Comments:    ,  A  

## 2013-04-12 NOTE — Progress Notes (Signed)
Late Entry for 04-11-13 Entered on 04-12-13  .Psychoeducational Group Note    Date: 04/12/2013 Time: 0930   Goal Setting Purpose of Group: To be able to set a goal that is measurable and that can be accomplished in one day Participation Level:  Active  Participation Quality:  Appropriate  Affect:  Appropriate  Cognitive:  Oriented  Insight:  Improving  Engagement in Group:  Engaged  Additional Comments:    ,  A 

## 2013-04-13 MED ORDER — ESCITALOPRAM OXALATE 20 MG PO TABS: 20.0000 mg | ORAL_TABLET | Freq: Every day | ORAL | Status: DC

## 2013-04-13 MED ORDER — TRAZODONE HCL 50 MG PO TABS: 50.0000 mg | ORAL_TABLET | Freq: Every evening | ORAL | Status: DC | PRN

## 2013-04-13 NOTE — Progress Notes (Signed)
[facility] :  Will you be returning to the same living situation after discharge: Yes,  Patient is returning to campus. At discharge, do you have transportation home?:Yes,  Patient to arrange transportation home. Do you have the ability to pay for your medications:Yes,  Patient is able to obtain medications.  Release of information consent forms completed and in the chart;  Patient's signature needed at discharge.  Patient to Follow up at: Follow-up Information   [facility] On 04/16/2013. (Thursday, April 16, 2013 3:00 PM)    Contact information:   [address] [place], [place]  [address]   [phone]      [facility] 04/[facility]/2013. (Please follow up with you scheduled appointment [facility])    Contact information:   [address] [place], [place]   [id number]  [phone]      Patient denies SI/HI:  Patient no longer endorsing SI/HI or other thoughts of self harm.  Safety Planning and Suicide Prevention discussed:  .Reviewed with all patients during discharge planning group  , Joesph JulyQuylle Hairston 04/13/2013, 12:48 PM

## 2013-04-13 NOTE — Progress Notes (Signed)
D:  Patient's self inventory sheet, patient sleeps well, good appetite, normal energy level, good attention span.  Denied anxiety, depression and hopeless.  Denied withdrawals.  Denied SI.  Denied physical problems.  Plans to delete off facebook, not talk to her or see her, not go to club, exercise, my friend to help me not be alone.  No problems taking medications after discharge. A:  Medications administered per MD orders.  Emotional support and encouragement given patient. R:  Denied SI and HI.  Denied A/V hallucinations.  Will continue to monitor patient for safety with 15 minute checks.  Safety maintained.

## 2013-04-13 NOTE — [facility] Suicide Risk Assessment (Signed)
   Demographic Factors:  Adolescent or young adult and Caucasian  Total Time spent with patient: 30 minutes  Psychiatric Specialty Exam: Physical Exam  Nursing note and vitals reviewed. Constitutional: She is oriented to person, place, and time. She appears well-developed.  HENT:  Head: Normocephalic.  Eyes: Pupils are equal, round, and reactive to light.  Neck: Normal range of motion.  Cardiovascular: Normal rate.   Respiratory: Effort normal.  GI: Soft.  Musculoskeletal: Normal range of motion.  Neurological: She is alert and oriented to person, place, and time.  Skin: Skin is warm.    Review of Systems  Psychiatric/Behavioral: Positive for depression. The patient is nervous/anxious.   All other systems reviewed and are negative.    Blood pressure 119/77, pulse 80, temperature 98.4 F (36.9 C), temperature source Oral, resp. rate 16, height 5\' 8"  (1.727 m), weight 65.318 kg (144 lb), last menstrual period 11/21/2011, SpO2 97.00%.Body mass index is 21.9 kg/(m^2).  General Appearance: Casual and Fairly Groomed  Patent attorneyye Contact::  Good  Speech:  Clear and Coherent  Volume:  Normal  Mood:  Euthymic  Affect:  Appropriate and Congruent  Thought Process:  Goal Directed and Intact  Orientation:  Full (Time, Place, and Person)  Thought Content:  WDL  Suicidal Thoughts:  No  Homicidal Thoughts:  No  Memory:  Immediate;   Good Recent;   Good  Judgement:  Intact  Insight:  Good  Psychomotor Activity:  Psychomotor Retardation  Concentration:  Good  Recall:  Good  Fund of Knowledge:Good  Language: Good  Akathisia:  NA  Handed:  Right  AIMS (if indicated):     Assets:  Communication Skills Desire for Improvement Financial Resources/Insurance Housing Leisure Time Physical Health Resilience Social Support Talents/Skills Transportation Vocational/Educational  Sleep:  Number of Hours: 6.75    Musculoskeletal: Strength & Muscle Tone: within normal limits Gait & Station:  normal Patient leans: N/A   Mental Status Per Nursing Assessment::   On Admission:     Current Mental Status by Physician: NA  Loss Factors: NA  Historical Factors: Prior suicide attempts, Family history of mental illness or substance abuse and Impulsivity  Risk Reduction Factors:   Sense of responsibility to family, Religious beliefs about death, Living with another person, especially a relative, Positive social support, Positive therapeutic relationship and Positive coping skills or problem solving skills  Continued Clinical Symptoms:  Depression:   Recent sense of peace/wellbeing Unstable or Poor Therapeutic Relationship Previous Psychiatric Diagnoses and Treatments  Cognitive Features That Contribute To Risk:  Polarized thinking    Suicide Risk:  Minimal: No identifiable suicidal ideation.  Patients presenting with no risk factors but with morbid ruminations; may be classified as minimal risk based on the severity of the depressive symptoms  Discharge Diagnoses:   AXIS I:  Major Depression, Recurrent severe AXIS II:  Deferred AXIS III:   Past Medical History  Diagnosis Date  . Anxiety   . Mental disorder   . Depression    AXIS IV:  other psychosocial or environmental problems, problems related to social environment and problems with primary support group AXIS V:  61-70 mild symptoms  Plan Of Care/Follow-up recommendations:  Activity:  as tolerated Diet:  Regular  Is patient on multiple antipsychotic therapies at discharge:  No   Has Patient had three or more failed trials of antipsychotic monotherapy by history:  No  Recommended Plan for Multiple Antipsychotic Therapies: NA    ,JANARDHAHA R. 04/13/2013, 12:56 PM

## 2013-04-13 NOTE — Tx Team (Signed)
Interdisciplinary Treatment Plan Update   Date Reviewed:  04/13/2013  Time Reviewed:  11:11 AM  Progress in Treatment:   Attending groups: Yes Participating in groups: Yes Taking medication as prescribed: Yes  Tolerating medication: Yes Family/Significant other contact made:  Yes,  collateral contact with mother. Patient understands diagnosis: Yes  Discussing patient identified problems/goals with staff: Yes Medical problems stabilized or resolved: Yes Denies suicidal/homicidal ideation: Yes Patient has not harmed self or others: Yes  For review of initial/current patient goals, please see plan of care.  Estimated Length of Stay:  Discharge today  Reasons for Continued Hospitalization:   New Problems/Goals identified:    Discharge Plan or Barriers:   Home with outpatient follow up to [facility] and [facility]  Additional Comments:    Attendees:  Patient: Rebecca Clay 04/13/2013 11:11 AM   Signature: Mervyn GayJ. Jonnalagadda, MD 04/13/2013 11:11 AM  Signature:  Claudette Headonrad Withrow, NP  04/13/2013 11:11 AM  Signature:   Quintella ReichertBeverly Knight, RN 04/13/2013 11:11 AM  Signature:  Neill Loftarol Davis, RN 04/13/2013 11:11 AM  Signature:   04/13/2013 11:11 AM  Signature:  Juline PatchQuylle , LCSW 04/13/2013 11:11 AM  Signature:  Reyes Ivanhelsea Horton, LCSW 04/13/2013 11:11 AM  Signature: Leisa LenzValerie Enoch, Transition Team, [facility] 04/13/2013 11:11 AM  Signature:   Cephas DarbyXavier Pandimakeel, LCSSW 04/13/2013 11:11 AM  Signature:  Chrisandra Nettersana Green, RN 04/13/2013  11:11 AM  Signature:   Onnie BoerJennifer Clark, RN [facility] 04/13/2013  11:11 AM  Signature:  Nadean CorwinKim Maggio, RN 04/13/2013  11:11 AM    Scribe for Treatment Team:   Juline PatchQuylle ,  04/13/2013 11:11 AM

## 2013-04-13 NOTE — Progress Notes (Signed)
Discharge Note:  Patient discharged home with family member.  Denied SI and HI.  Denied A/V hallucinations.  Patient stated she received all her belongings, clothing, miscellaneous items, toiletries, medications, prescriptions, jewelry, cards, shoes.  Patient was given suicide prevention information which was discussed with patient and she stated she understood and had no questions.  Patient stated she [facility] all [facility].

## 2013-04-13 NOTE — [facility] Group Notes (Signed)
[facility] Dis[facility] Planning [facility] Note   04/13/2013 11:09 AM    Participation Quality:  Appropraite  Mood/Affect:  Appropriate  Depression Rating:  0  Anxiety Rating:  0  Thoughts of Suicide:  No  Will you contract for safety?   NA  Current AVH:  No  Plan for Discharge/Comments:  Patient attended discharge planning group and actively participated in group.  She [facility] and [facility].  CSW provided all participants with daily workbook.   Transportation Means: Patient has transportation.   Supports:  Patient has a support system.   , Joesph JulyQuylle Clay

## 2013-04-13 NOTE — Discharge Summary (Signed)
Physician Discharge Summary Note  Patient:  Rebecca Clay Record is an 23 y.o., female MRN:  [id number] DOB:  1990-11-05 Patient phone:  [phone] (home)  Patient address:   [address] [place] [id number],  Total Time spent with patient: Greater than 30 minutes  Date of Admission:  04/09/2013 Date of Discharge: 04/13/2013  Reason for Admission:  MDD with SI with attempt to OD on Ambien  Discharge Diagnoses: Principal Problem:   MDD (major depressive disorder), recurrent severe, without psychosis   Psychiatric Specialty Exam: Physical Exam  Review of Systems  Constitutional: Negative.   HENT: Negative.   Eyes: Negative.   Respiratory: Negative.   Cardiovascular: Negative.   Gastrointestinal: Negative.   Genitourinary: Negative.   Musculoskeletal: Negative.   Skin: Negative.   Neurological: Negative.   Endo/Heme/Allergies: Negative.   Psychiatric/Behavioral: Positive for depression. Negative for suicidal ideas, hallucinations and substance abuse. The patient is nervous/anxious. The patient does not have insomnia.     Blood pressure 119/77, pulse 80, temperature 98.4 F (36.9 C), temperature source Oral, resp. rate 16, height 5\' 8"  (1.727 m), weight 65.318 kg (144 lb), last menstrual period 11/21/2011, SpO2 97.00%.Body mass index is 21.9 kg/(m^2).  General Appearance: Casual  Eye Contact::  Good  Speech:  Clear and Coherent  Volume:  Normal  Mood:  Euthymic  Affect:  Appropriate  Thought Process:  Coherent  Orientation:  Full (Time, Place, and Person)  Thought Content:  WDL  Suicidal Thoughts:  No  Homicidal Thoughts:  No  Memory:  Immediate;   Good Recent;   Good Remote;   Good  Judgement:  Good  Insight:  Good  Psychomotor Activity:  Normal  Concentration:  Good  Recall:  Good  Fund of Knowledge:Good  Language: Good  Akathisia:  NA  Handed:  Right  AIMS (if indicated):     Assets:  Communication Skills Desire for Improvement Resilience  Sleep:  Number of  Hours: 6.75     Musculoskeletal: Strength & Muscle Tone: within normal limits Gait & Station: normal Patient leans: N/A  DSM5: Depressive Disorders:  Major Depressive Disorder - Severe (296.23)  Axis Diagnosis:   AXIS I:  Major Depression, Recurrent severe AXIS II:  Deferred AXIS III:   Past Medical History  Diagnosis Date  . Anxiety   . Mental disorder   . Depression    AXIS IV:  other psychosocial or environmental problems and problems related to social environment AXIS V:  61-70 mild symptoms  Level of Care:  OP  Hospital Course:  Rebecca Clay is an 23 y.o. female. [facility] after intentional overdose on an unknown amount of her ambien sleeping pills. Patient reports that she recently broke up with her partner on 02-23-13. Patient reports that her ex-partner sent a message to her on Facebook several days ago telling patient that she wanted to "fuck" her,this was clarified as patient's ex-partner wanting patient to come over and have sex with her. Patient reports that she broke down and began crying. Patient states that she felt that she could no longer continue in the relationship with the perks to include sex, kissing as patient describes the perks of the relationship as a "fun bubble". Pt reports that she did not want to continue in the relationship without a commitment. Patient reports that she informed her partner that she could not continue in the relationship without a commitment and her ex-partner informed her that she no longer wanted any contact with  the patient. Patient reports that his made her feel sad and sent her over the edge. Patient begins crying during assessment reporting that she misses her ex-girlfriend and can't cope with not being with her. Patient reports that she cut on her arm on 04-03-13 after having her last conversation with her ex-girlfriend. Pt denies prior history of cutting.Pt reports prior history of suicide attempt in  the fall of 2014 when she started college. Patient reports that she could not handle the stress and attempted to kill herself by choking herself. Pt reports that she has been diagnosed with Auditory Processing Recessive Disorder Learning Disability. Pt denies HI and no AVH reported. Patient is unable to contract for safety and inpatient treatment is recommended for safety and stabilization.  During Hospitalization: Medications managed, psychoeducation, group and individual therapy. Pt currently denies SI, HI, and Psychosis. At discharge, pt rates anxiety at 1/10 and depression at 1/10. Pt states that she does have a good supportive home environment and will followup with outpatient treatment. Pt is currently a Archivist, states that she will stay with her mom during Spring Break which starts in a few days, then will move back to on campus housing. Pt's left arm superficial lacerations are healing well and without sign of infection. Affirms agreement with medication regimen and discharge plan. Denies other physical and psychological concerns at time of discharge.   Consults:  None  Significant Diagnostic Studies:  None  Discharge Vitals:   Blood pressure 119/77, pulse 80, temperature 98.4 F (36.9 C), temperature source Oral, resp. rate 16, height 5\' 8"  (1.727 m), weight 65.318 kg (144 lb), last menstrual period 11/21/2011, SpO2 97.00%. Body mass index is 21.9 kg/(m^2). Lab Results:   No results found for this or any previous visit (from the past 72 hour(s)).  Physical Findings: AIMS: Facial and Oral Movements Muscles of Facial Expression: None, normal Lips and Perioral Area: None, normal Jaw: None, normal Tongue: None, normal,Extremity Movements Upper (arms, wrists, hands, fingers): None, normal Lower (legs, knees, ankles, toes): None, normal, Trunk Movements Neck, shoulders, hips: None, normal, Overall Severity Severity of abnormal movements (highest score from questions above): None,  normal Incapacitation due to abnormal movements: None, normal Patient's awareness of abnormal movements (rate only patient's report): No Awareness, Dental Status Current problems with teeth and/or dentures?: No Does patient usually wear dentures?: No  CIWA:  CIWA-Ar Total: 2 COWS:  COWS Total Score: 2  Psychiatric Specialty Exam: See Psychiatric Specialty Exam and Suicide Risk Assessment completed by Attending Physician prior to discharge.  Discharge destination:  Home  Is patient on multiple antipsychotic therapies at discharge:  No   Has Patient had three or more failed trials of antipsychotic monotherapy by history:  No  Recommended Plan for Multiple Antipsychotic Therapies: NA     Medication List    STOP taking these medications       PRESCRIPTION MEDICATION     zolpidem 5 MG tablet  Commonly known as:  AMBIEN      TAKE these medications     Indication   escitalopram 20 MG tablet  Commonly known as:  LEXAPRO  Take 1 tablet (20 mg total) by mouth daily.   Indication:  mood stabilization     traZODone 50 MG tablet  Commonly known as:  DESYREL  Take 1 tablet (50 mg total) by mouth at bedtime as needed for sleep.   Indication:  Trouble Sleeping         Follow-up recommendations:  Activity:  As  tolerated Diet:  Heart healthy with low sodium.  Comments:   Take all medications as prescribed. Keep all follow-up appointments as scheduled.  Do not consume alcohol or use illegal drugs while on prescription medications. Report any adverse effects from your medications to your primary care provider promptly.  In the event of recurrent symptoms or worsening symptoms, call 911, a crisis hotline, or go to the nearest emergency department for evaluation.   Total Discharge Time:  Greater than 30 minutes.  Signed: [name], FNP-BC 04/13/2013, 12:20 PM  Patient was seen for psychiatric evaluation, suicidal risk assessment and case discussed with treatment team and  physician extender and disposition plan. Reviewed the information documented and agree with the treatment plan.  ,JANARDHAHA R. 04/13/2013 4:26 PM

## 2013-04-15 NOTE — Progress Notes (Signed)
Patient Discharge Instructions:  After Visit Summary (AVS):   Faxed to:  04/15/13 Discharge Summary Note:   Faxed to:  04/15/13 Psychiatric Admission Assessment Note:   Faxed to:  04/15/13 Suicide Risk Assessment - Discharge Assessment:   Faxed to:  04/15/13 Faxed/Sent to the Next Level Care provider:  04/15/13 Faxed to [facility] @ [phone] Faxed to [facility] @ [phone]  Jerelene ReddenSheena E Rodanthe, 04/15/2013, 3:53 PM

## 2013-10-08 ENCOUNTER — Other Ambulatory Visit: Payer: Self-pay | Admitting: Family Medicine

## 2013-10-08 ENCOUNTER — Ambulatory Visit
Admission: RE | Admit: 2013-10-08 | Discharge: 2013-10-08 | Disposition: A | Discharge status: Home / Self Care | Payer: BC Managed Care – PPO | Source: Ambulatory Visit | Attending: Family Medicine | Admitting: Family Medicine

## 2013-10-08 DIAGNOSIS — R079 Chest pain, unspecified: Secondary | ICD-10-CM

## 2013-10-09 ENCOUNTER — Ambulatory Visit: Payer: BC Managed Care – PPO | Attending: Family Medicine | Admitting: Physical Therapy

## 2013-10-09 DIAGNOSIS — M25519 Pain in unspecified shoulder: Secondary | ICD-10-CM | POA: Insufficient documentation

## 2013-10-09 DIAGNOSIS — IMO0001 Reserved for inherently not codable concepts without codable children: Secondary | ICD-10-CM | POA: Diagnosis not present

## 2013-10-09 DIAGNOSIS — M2560 Stiffness of unspecified joint, not elsewhere classified: Secondary | ICD-10-CM | POA: Insufficient documentation

## 2013-10-09 DIAGNOSIS — M546 Pain in thoracic spine: Secondary | ICD-10-CM | POA: Diagnosis not present

## 2013-10-21 ENCOUNTER — Ambulatory Visit: Payer: BC Managed Care – PPO | Attending: Family Medicine | Admitting: Physical Therapy

## 2013-10-21 DIAGNOSIS — M2560 Stiffness of unspecified joint, not elsewhere classified: Secondary | ICD-10-CM | POA: Insufficient documentation

## 2013-10-21 DIAGNOSIS — IMO0001 Reserved for inherently not codable concepts without codable children: Secondary | ICD-10-CM | POA: Diagnosis present

## 2013-10-21 DIAGNOSIS — M25519 Pain in unspecified shoulder: Secondary | ICD-10-CM | POA: Diagnosis not present

## 2013-10-21 DIAGNOSIS — M546 Pain in thoracic spine: Secondary | ICD-10-CM | POA: Diagnosis not present

## 2013-10-28 ENCOUNTER — Ambulatory Visit: Payer: BC Managed Care – PPO | Admitting: Physical Therapy

## 2013-10-28 DIAGNOSIS — IMO0001 Reserved for inherently not codable concepts without codable children: Secondary | ICD-10-CM | POA: Diagnosis not present

## 2013-11-11 ENCOUNTER — Ambulatory Visit: Payer: BC Managed Care – PPO | Admitting: Physical Therapy

## 2013-11-11 DIAGNOSIS — IMO0001 Reserved for inherently not codable concepts without codable children: Secondary | ICD-10-CM | POA: Diagnosis not present

## 2013-11-18 ENCOUNTER — Ambulatory Visit: Payer: BC Managed Care – PPO | Attending: Family Medicine | Admitting: Physical Therapy

## 2013-11-18 DIAGNOSIS — M546 Pain in thoracic spine: Secondary | ICD-10-CM | POA: Insufficient documentation

## 2013-11-18 DIAGNOSIS — Z5189 Encounter for other specified aftercare: Secondary | ICD-10-CM | POA: Diagnosis present

## 2013-11-18 DIAGNOSIS — M2560 Stiffness of unspecified joint, not elsewhere classified: Secondary | ICD-10-CM | POA: Insufficient documentation

## 2013-11-18 DIAGNOSIS — M25511 Pain in right shoulder: Secondary | ICD-10-CM | POA: Diagnosis not present

## 2013-11-25 ENCOUNTER — Ambulatory Visit: Payer: BC Managed Care – PPO | Admitting: Physical Therapy

## 2013-11-25 DIAGNOSIS — Z5189 Encounter for other specified aftercare: Secondary | ICD-10-CM | POA: Diagnosis not present

## 2013-12-02 ENCOUNTER — Ambulatory Visit: Payer: BC Managed Care – PPO | Admitting: Physical Therapy

## 2013-12-02 DIAGNOSIS — Z5189 Encounter for other specified aftercare: Secondary | ICD-10-CM | POA: Diagnosis not present

## 2013-12-09 ENCOUNTER — Ambulatory Visit: Payer: BC Managed Care – PPO | Admitting: Physical Therapy

## 2013-12-09 DIAGNOSIS — Z5189 Encounter for other specified aftercare: Secondary | ICD-10-CM | POA: Diagnosis not present

## 2013-12-16 ENCOUNTER — Ambulatory Visit: Payer: BC Managed Care – PPO | Attending: Family Medicine | Admitting: Physical Therapy

## 2013-12-16 DIAGNOSIS — M25511 Pain in right shoulder: Secondary | ICD-10-CM | POA: Diagnosis not present

## 2013-12-16 DIAGNOSIS — M2560 Stiffness of unspecified joint, not elsewhere classified: Secondary | ICD-10-CM | POA: Insufficient documentation

## 2013-12-16 DIAGNOSIS — M546 Pain in thoracic spine: Secondary | ICD-10-CM | POA: Diagnosis not present

## 2013-12-16 DIAGNOSIS — Z5189 Encounter for other specified aftercare: Secondary | ICD-10-CM | POA: Insufficient documentation

## 2013-12-23 ENCOUNTER — Ambulatory Visit: Payer: BC Managed Care – PPO | Admitting: Physical Therapy

## 2013-12-23 DIAGNOSIS — Z5189 Encounter for other specified aftercare: Secondary | ICD-10-CM | POA: Diagnosis not present

## 2013-12-30 ENCOUNTER — Ambulatory Visit: Payer: BC Managed Care – PPO | Admitting: Physical Therapy

## 2013-12-30 DIAGNOSIS — Z5189 Encounter for other specified aftercare: Secondary | ICD-10-CM | POA: Diagnosis not present

## 2014-01-13 ENCOUNTER — Ambulatory Visit: Payer: BC Managed Care – PPO | Attending: Family Medicine

## 2014-01-13 DIAGNOSIS — M2560 Stiffness of unspecified joint, not elsewhere classified: Secondary | ICD-10-CM | POA: Insufficient documentation

## 2014-01-13 DIAGNOSIS — M546 Pain in thoracic spine: Secondary | ICD-10-CM | POA: Insufficient documentation

## 2014-01-13 DIAGNOSIS — M25511 Pain in right shoulder: Secondary | ICD-10-CM | POA: Insufficient documentation

## 2014-01-13 DIAGNOSIS — Z5189 Encounter for other specified aftercare: Secondary | ICD-10-CM | POA: Insufficient documentation

## 2014-03-29 ENCOUNTER — Ambulatory Visit: Payer: BLUE CROSS/BLUE SHIELD

## 2014-04-04 ENCOUNTER — Emergency Department (HOSPITAL_COMMUNITY)
Admission: EM | Admit: 2014-04-04 | Discharge: 2014-04-04 | Disposition: A | Discharge status: Home / Self Care | Payer: Worker's Compensation | Attending: Emergency Medicine | Admitting: Emergency Medicine

## 2014-04-04 ENCOUNTER — Emergency Department (HOSPITAL_COMMUNITY): Payer: Worker's Compensation

## 2014-04-04 ENCOUNTER — Encounter (HOSPITAL_COMMUNITY): Payer: Self-pay | Admitting: Emergency Medicine

## 2014-04-04 DIAGNOSIS — Z793 Long term (current) use of hormonal contraceptives: Secondary | ICD-10-CM | POA: Diagnosis not present

## 2014-04-04 DIAGNOSIS — S5001XA Contusion of right elbow, initial encounter: Secondary | ICD-10-CM

## 2014-04-04 DIAGNOSIS — Y998 Other external cause status: Secondary | ICD-10-CM | POA: Diagnosis not present

## 2014-04-04 DIAGNOSIS — Y9389 Activity, other specified: Secondary | ICD-10-CM | POA: Diagnosis not present

## 2014-04-04 DIAGNOSIS — W108XXA Fall (on) (from) other stairs and steps, initial encounter: Secondary | ICD-10-CM | POA: Insufficient documentation

## 2014-04-04 DIAGNOSIS — Y9289 Other specified places as the place of occurrence of the external cause: Secondary | ICD-10-CM | POA: Insufficient documentation

## 2014-04-04 DIAGNOSIS — Z79899 Other long term (current) drug therapy: Secondary | ICD-10-CM | POA: Diagnosis not present

## 2014-04-04 DIAGNOSIS — F329 Major depressive disorder, single episode, unspecified: Secondary | ICD-10-CM | POA: Diagnosis not present

## 2014-04-04 DIAGNOSIS — F419 Anxiety disorder, unspecified: Secondary | ICD-10-CM | POA: Diagnosis not present

## 2014-04-04 DIAGNOSIS — S4992XA Unspecified injury of left shoulder and upper arm, initial encounter: Secondary | ICD-10-CM | POA: Diagnosis present

## 2014-04-04 MED ORDER — TRAMADOL HCL 50 MG PO TABS: 50.0000 mg | ORAL_TABLET | Freq: Four times a day (QID) | ORAL | Status: DC | PRN

## 2014-04-04 MED ORDER — NAPROXEN 500 MG PO TABS: 500.0000 mg | ORAL_TABLET | Freq: Two times a day (BID) | ORAL | Status: DC

## 2014-04-04 NOTE — Discharge Instructions (Signed)
Elbow Contusion An elbow contusion is a deep bruise of the elbow. Contusions are the result of an injury that caused bleeding under the skin. The contusion may turn blue, purple, or yellow. Minor injuries will give you a painless contusion, but more severe contusions may stay painful and swollen for a few weeks.  CAUSES  An elbow contusion comes from a direct force to that area, such as falling on the elbow. SYMPTOMS   Swelling and redness of the elbow.  Bruising of the elbow area.  Tenderness or soreness of the elbow. DIAGNOSIS  You will have a physical exam and will be asked about your history. You may need an X-ray of your elbow to look for a broken bone (fracture).  TREATMENT  A sling or splint may be needed to support your injury. Resting, elevating, and applying cold compresses to the elbow area are often the best treatments for an elbow contusion. Over-the-counter medicines may also be recommended for pain control. HOME CARE INSTRUCTIONS   Put ice on the injured area.  Put ice in a plastic bag.  Place a towel between your skin and the bag.  Leave the ice on for 15-20 minutes, 03-04 times a day.  Only take over-the-counter or prescription medicines for pain, discomfort, or fever as directed by your caregiver.  Rest your injured elbow until the pain and swelling are better.  Elevate your elbow to reduce swelling.  Apply a compression wrap as directed by your caregiver. This can help reduce swelling and motion. You may remove the wrap for sleeping, showers, and baths. If your fingers become numb, cold, or blue, take the wrap off and reapply it more loosely.  Use your elbow only as directed by your caregiver. You may be asked to do range of motion exercises. Do them as directed.  See your caregiver as directed. It is very important to keep all follow-up appointments in order to avoid any long-term problems with your elbow, including chronic pain or inability to move your elbow  normally. SEEK IMMEDIATE MEDICAL CARE IF:   You have increased redness, swelling, or pain in your elbow.  Your swelling or pain is not relieved with medicines.  You have swelling of the hand and fingers.  You are unable to move your fingers or wrist.  You begin to lose feeling in your hand or fingers.  Your fingers or hand become cold or blue. MAKE SURE YOU:   Understand these instructions.  Will watch your condition.  Will get help right away if you are not doing well or get worse. Document Released: 01/07/2006 Document Revised: 04/23/2011 Document Reviewed: 12/15/2010 ExitCare Patient Information 2015 [employer]. This information is not intended to replace advice given to you by your health care provider. Make sure you discuss any questions you have with your health care provider.  

## 2014-04-04 NOTE — ED Notes (Signed)
Pt reports falling off third step of a ladder onto concrete, tried to catch herself with R arm. Pt c/o pain to R elbow and forearm. Swelling noted to forearm. Pt able to move all fingers and wrist, unable to move elbow d/t pain.

## 2014-04-04 NOTE — ED Provider Notes (Signed)
CSN: [id number]     Arrival date & time 04/04/14  2051 History  This chart was scribed for Rebecca Madura, PA-C, working with Purvis Sheffield, MD by Chestine Spore, ED Scribe. The patient was seen in room WTR5/WTR5 at 9:12 PM.    Chief Complaint  Patient presents with  . Arm Injury    The history is provided by the patient. No language interpreter was used.    HPI Comments: Rebecca Clay is a 24 y.o. female who presents to the Emergency Department complaining of right arm injury onset 3 hours PTA. Pt describes the pain as sharp. Pt was working and fell backwards off the top of a three step ladder. Pt tried to catch herself with her elbows when she fell. Pt is having pain to her elbow and the forearm. She states that she is having associated symptoms of arthralgia. She states that she has tried 4 advil around 6:30 PM with no relief for her symptoms. She denies loss of sensation and any other symptoms. Pt drove from [place] to the ED.   Past Medical History  Diagnosis Date  . Anxiety   . Mental disorder   . Depression    History reviewed. No pertinent past surgical history. No family history on file. History  Substance Use Topics  . Smoking status: Never Smoker   . Smokeless tobacco: Not on file  . Alcohol Use: Yes   OB History    No data available      Review of Systems  Musculoskeletal: Positive for arthralgias. Negative for joint swelling.    Allergies  Review of patient's allergies indicates no known allergies.  Home Medications   Prior to Admission medications   Medication Sig Start Date End Date Taking? Authorizing Provider  albuterol (PROVENTIL HFA;VENTOLIN HFA) 108 (90 BASE) MCG/ACT inhaler Inhale 1 puff into the lungs every 6 (six) hours as needed for wheezing or shortness of breath.   Yes Historical Provider, MD  drospirenone-ethinyl estradiol (YAZ,GIANVI,LORYNA) 3-0.02 MG tablet Take 1 tablet by mouth daily.   Yes Historical Provider, MD  escitalopram (LEXAPRO) 20 MG  tablet Take 1 tablet (20 mg total) by mouth daily. 04/13/13  Yes [name], FNP  naproxen (NAPROSYN) 500 MG tablet Take 500 mg by mouth 2 (two) times daily with a meal.   Yes Historical Provider, MD  naproxen (NAPROSYN) 500 MG tablet Take 1 tablet (500 mg total) by mouth 2 (two) times daily. 04/04/14   Rebecca Madura, PA-C  traMADol (ULTRAM) 50 MG tablet Take 1 tablet (50 mg total) by mouth every 6 (six) hours as needed. 04/04/14   Rebecca Madura, PA-C  traZODone (DESYREL) 50 MG tablet Take 1 tablet (50 mg total) by mouth at bedtime as needed for sleep. Patient not taking: Reported on 04/04/2014 04/13/13   [name], FNP   BP 144/90 mmHg  Pulse 78  Temp(Src) 97.9 F (36.6 C) (Oral)  Resp 17  Ht  (1.702 m)  Wt 154 lb (69.854 kg)  BMI 24.11 kg/m2  SpO2 100%  Physical Exam  Constitutional: She is oriented to person, place, and time. She appears well-developed and well-nourished. No distress.  Nontoxic/nonseptic appearing  HENT:  Head: Normocephalic and atraumatic.  Eyes: Conjunctivae and EOM are normal. No scleral icterus.  Neck: Normal range of motion.  Cardiovascular: Normal rate, regular rhythm and intact distal pulses.   Distal radial pulse 2+ in RUE; capillary refill brisk in all digits  Pulmonary/Chest: Effort normal. No respiratory distress.  Musculoskeletal:  Normal range of motion.       Right elbow: She exhibits normal range of motion, no deformity and no laceration. Tenderness found. Medial epicondyle tenderness noted. No radial head, no lateral epicondyle and no olecranon process tenderness noted.       Right upper arm: Normal.       Right forearm: Normal.  5/5 strength with elbow flexion and extension against resistance  Neurological: She is alert and oriented to person, place, and time. She exhibits normal muscle tone. Coordination normal.  Sensation to light touch intact. Patient has normal grip strength in her right upper extremity.  Skin: Skin is warm and dry. No rash  noted. She is not diaphoretic. No erythema. No pallor.  Psychiatric: She has a normal mood and affect. Her behavior is normal.  Nursing note and vitals reviewed.   ED Course  Procedures (including critical care time) DIAGNOSTIC STUDIES: Oxygen Saturation is 100% on RA, normal by my interpretation.    COORDINATION OF CARE: 9:16 PM-Discussed treatment plan which includes right elbow X-ray, right forearm X-ray, ice with pt at bedside and pt agreed to plan.   Labs Review Labs Reviewed - No data to display  Imaging Review Dg Elbow Complete Right  04/04/2014   CLINICAL DATA:  Right arm and elbow pain after fall from ladder.  EXAM: RIGHT ELBOW - COMPLETE 3+ VIEW  COMPARISON:  None.  FINDINGS: No fracture or dislocation. The alignment and joint spaces are maintained. There is no elbow joint effusion.  IMPRESSION: No fracture or dislocation of the right elbow.   Electronically Signed   By: Rubye OaksMelanie  Ehinger M.D.   On: 04/04/2014 21:36   Dg Forearm Right  04/04/2014   CLINICAL DATA:  Right forearm and elbow pain after fall from ladder.  EXAM: RIGHT FOREARM - 2 VIEW  COMPARISON:  None.  FINDINGS: No fracture or dislocation. The cortical margins of the radius and ulna are intact. Wrist and elbow alignment is maintained. No focal soft tissue abnormality.  IMPRESSION: Intact right forearm, no fracture.   Electronically Signed   By: Rubye OaksMelanie  Ehinger M.D.   On: 04/04/2014 21:35     EKG Interpretation None      MDM   Final diagnoses:  Elbow contusion, right, initial encounter    24 year old female presents to the emergency department for further evaluation of right elbow pain consistent with contusion. Imaging negative for fracture, dislocation, or bony deformity. Patient neurovascularly intact. No crepitus or deformity noted on exam. Will manage symptoms as outpatient with foam arm sling and NSAIDs. Short course of tramadol given for pain control. Return precautions discussed and provided. Patient  agreeable to plan with no unaddressed concerns.  I personally performed the services described in this documentation, which was scribed in my presence. The recorded information has been reviewed and is accurate.   Filed Vitals:   04/04/14 2105  BP: 144/90  Pulse: 78  Temp: 97.9 F (36.6 C)  TempSrc: Oral  Resp: 17  Height: 5\' 7"  (1.702 m)  Weight: 154 lb (69.854 kg)  SpO2: 100%     Rebecca MaduraKelly , PA-C 04/04/14 2159  Purvis SheffieldForrest Harrison, MD 04/04/14 [phone]

## 2014-04-08 ENCOUNTER — Ambulatory Visit: Payer: BLUE CROSS/BLUE SHIELD | Attending: Physical Medicine and Rehabilitation

## 2014-04-08 DIAGNOSIS — M5441 Lumbago with sciatica, right side: Secondary | ICD-10-CM | POA: Diagnosis present

## 2014-04-08 DIAGNOSIS — M25659 Stiffness of unspecified hip, not elsewhere classified: Secondary | ICD-10-CM | POA: Diagnosis not present

## 2014-04-08 NOTE — Patient Instructions (Addendum)
Knee to Chest    Lying supine, bend involved knee to chest.  Hold 20 seconds and repeat _3__ times. Repeat with other leg. Do __3_ times per day.  Copyright  VHI. All rights reserved.  Double Knee to Chest (Flexion)   Gently pull both knees toward chest. Feel stretch in lower back or buttock area. Breathing deeply, Hold __20__ seconds. Repeat _3___ times. Do _3Trunk: Rotation  Copyright  VHI. All rights reserved.  Lower Trunk Rotation  FEET ON THE BED OR FLOOR Bring both knees in to chest. Rotate from side to side, keeping knees together and feet off floor. Hold 20 seconds Repeat __3__ times per set. Do __1_ sets per session. Do _3___ sessions per day.  http://orth.exer.us/151   Copyright  VHI. All rights reserved.

## 2014-04-08 NOTE — Therapy (Signed)
[facility] [address] [place], [place], [address] Phone: [phone]   Fax:  [phone]  Physical Therapy Evaluation  Patient Details  Name: Rebecca Clay MRN: [id number] Date of Birth: 11-28-1990 Referring Provider:  Tyrell Antonio, MD  Encounter Date: 04/08/2014      PT End of Session - 04/08/14 1010    Visit Number 1   Date for PT Re-Evaluation 05/27/14   PT Start Time 0930   PT Stop Time 1025   PT Time Calculation (min) 55 min   Equipment Utilized During Treatment Other (comment)   Activity Tolerance Patient tolerated treatment well   Behavior During Therapy [facility] for tasks assessed/performed      Past Medical History  Diagnosis Date  . Anxiety   . Mental disorder   . Depression     Past Surgical History  Procedure Laterality Date  . Knee arthroscopy with anterior cruciate ligament (acl) repair Right 2007    LMP 12/27/2013  Visit Diagnosis:  Bilateral low back pain with right-sided sciatica - Plan: PT plan of care cert/re-cert  Hip stiffness, unspecified laterality - Plan: PT plan of care cert/re-cert      Subjective Assessment - 04/08/14 0935    Symptoms Pt is a 24 y.o. female who presents to PT with chronic history of LBP.   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   Currently in Pain? 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  No   Is the patient reluctant to leave their home because of a fear of falling?  No   Home Environment   Living Enviornment Private residence   Home Access Stairs to enter  2nd story apt.   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   Leisure Pt denies regular exercise   Cognition   Overall Cognitive Status Within Functional Limits for tasks assessed   Observation/Other Assessments   Focus on Therapeutic Outcomes (FOTO)  37% limitation   ROM / Strength   AROM / PROM / Strength AROM;PROM;Strength   AROM   Overall AROM  Due to pain   Overall AROM Comments Lumbar is full with pain reported at end range flexion, extension and Rt sidebending   AROM Assessment Site Lumbar   Lumbar Flexion full   Lumbar Extension full   Lumbar - Right Side Bend full with pain   Lumbar - Left Side  Bend full   Lumbar - Right Rotation full   Lumbar - Left Rotation full    PROM   Overall PROM  Deficits   Overall PROM Comments Bilateral hip flexibility is limited by 25% into ER and IR, hamstring flexibility limited by 40%   PROM Assessment Site Hip   Strength   Overall Strength Deficits   Overall Strength Comments Bilateral LE strngth 4+/5 throughout   Strength Assessment Site Hip;Knee;Ankle   Palpation   Palpation Pt with muscle tension noted over bilateral lumbar parapsinals.  Pt with reduce PA mobility and pain T11-L4.   Special Tests    Special Tests Lumbar   Slump test   Findings Positive   Side Right   Comment Posterior knee pain reported   Straight Leg Raise   Findings Negative   Side  Right    Ambulation/Gait   Ambulation/Gait Yes   Ambulation/Gait Assistance 7: Sheilah Pigeon Adult PT Treatment/Exercise - 04/08/14 0001    Exercises   Exercises Knee/Hip;Lumbar   Lumbar Exercises: Stretches   Single Knee to Chest Stretch 3 reps;20 seconds   Double Knee to Chest Stretch 3 reps;20 seconds   Lower Trunk Rotation 3 reps;20 seconds   Modalities   Modalities Electrical Stimulation;Moist Heat   Moist Heat Therapy   Number Minutes Moist Heat 15 Minutes   Moist Heat Location Other (comment)  lumbar   Electrical Stimulation   Electrical Stimulation Location lumbar   Electrical Stimulation Action interferential   Electrical Stimulation Parameters 15 min   Electrical Stimulation Goals Pain                PT Education - 04/08/14 1004    Education provided Yes   Education Details lumar/hip flexion   Person(s) Educated Patient   Methods Explanation;Demonstration   Comprehension Verbalized understanding;Returned demonstration;Verbal cues required          PT Short Term Goals - 04/08/14 0943    PT SHORT TERM GOAL #1   Title be independent with initial HEP   Time 4   Period Weeks   Status New   PT SHORT TERM GOAL #2   Title report < or = to 5/10 LBP with work tasks   Time 4   Period Weeks   Status New   PT SHORT TERM GOAL #3   Title report a 30% reduction in LBP with ADLs and self-care   Time 4   Period Weeks   Status New           PT Long Term Goals - 04/08/14 0931    PT LONG TERM GOAL #1   Title be independent in advanced HEP   Time 8   Period Weeks   PT LONG TERM GOAL #2   Title reduce FOTO to < or = to 29% limitation   Time 8   Period Weeks   Status New   PT LONG TERM GOAL #3   Title report < or = to 3/10 LBP at the end of a work shift   Time 8   Period Weeks   Status New   PT LONG TERM GOAL #4   Title report a 60% reduction in LBP with ADLs and self-care   Time 8   Period Weeks   Status New                Plan - 04/08/14 1011  Clinical Impression Statement Pt presents with chronic LBP and stiffness.  Pt will benefit from PT to adress core strength and flexibility   Pt will benefit from skilled therapeutic intervention in order to improve on the following deficits Impaired flexibility;Pain;Decreased endurance;Decreased activity tolerance   Rehab Potential Good   PT Frequency 2x / week   PT Duration 8 weeks   PT Treatment/Interventions ADLs/Self Care Home Management;Electrical Stimulation;Therapeutic exercise;Patient/family education;Moist Heat;Manual techniques;Neuromuscular re-education;Cryotherapy;Ultrasound;Therapeutic activities   PT Next Visit Plan Body mechanics education, hip and lumbar flexibility, core stab, modalities   Consulted and Agree with Plan of Care Patient         Problem List Patient Active Problem List   Diagnosis Date Noted  . MDD (major depressive disorder), recurrent severe, without psychosis 04/09/2013  . Suicidal ideations 04/08/2013  . Overdose 04/08/2013    ,, PT 04/08/2014, 11:18 AM  [facility] [address], [address] [place], [place], [address] Phone: [phone]   Fax:  [phone]

## 2014-04-13 ENCOUNTER — Ambulatory Visit: Payer: BLUE CROSS/BLUE SHIELD | Attending: Physical Medicine and Rehabilitation

## 2014-04-13 DIAGNOSIS — M5441 Lumbago with sciatica, right side: Secondary | ICD-10-CM | POA: Insufficient documentation

## 2014-04-13 DIAGNOSIS — M25659 Stiffness of unspecified hip, not elsewhere classified: Secondary | ICD-10-CM | POA: Insufficient documentation

## 2014-04-13 NOTE — Therapy (Signed)
[facility] [address] [place], [place], [address] Phone: [phone]   Fax:  [phone]  Physical Therapy Treatment  Patient Details  Name: Rebecca Clay MRN: [id number] Date of Birth: 11/24/1990 Referring Provider:  Frederich Chick, MD  Encounter Date: 04/13/2014      PT End of Session - 04/13/14 0824    Visit Number 2   Date for PT Re-Evaluation 05/27/14   PT Start Time 0803   PT Stop Time 0902   PT Time Calculation (min) 59 min   Activity Tolerance Patient tolerated treatment well   Behavior During Therapy [facility] for tasks assessed/performed      Past Medical History  Diagnosis Date  . Anxiety   . Mental disorder   . Depression     Past Surgical History  Procedure Laterality Date  . Knee arthroscopy with anterior cruciate ligament (acl) repair Right 2007    LMP 12/27/2013  Visit Diagnosis:  Bilateral low back pain with right-sided sciatica  Hip stiffness, unspecified laterality      Subjective Assessment - 04/13/14 0807    Symptoms Exercises are going well.  Pt worked on Sunday. E-stim and heat helped last session.   Currently in Pain? Yes   Pain Score 5    Pain Location Back   Pain Orientation Right;Left;Lower   Pain Descriptors / Indicators Sore   Pain Type Chronic pain   Aggravating Factors  laying around or being in the same position, bending over at work   Pain Relieving Factors Meds, stretching, ice, massage   Multiple Pain Sites No                    [facility] Adult PT Treatment/Exercise - 04/13/14 0001    Lumbar Exercises: Stretches   Single Knee to Chest Stretch 3 reps;20 seconds   Double Knee to Chest Stretch 3 reps;20 seconds   Lower Trunk Rotation 3 reps;20 seconds  Pt with good return demo of all HEP   Lumbar Exercises: Seated   Other Seated Lumbar Exercises thoracolumbar rotation 3x20 seconds   Lumbar Exercises: Quadruped   [place]cat/Old Horse 10 reps   Other Quadruped  Lumbar Exercises prayer stretch 3x20 sec.   Knee/Hip Exercises: Aerobic   Stationary Bike Level 2x 7 minutes  PT present to discuss progress with patient.   Modalities   Modalities Ultrasound   Moist Heat Therapy   Number Minutes Moist Heat 15 Minutes   Moist Heat Location Other (comment)  lumber   Electrical Stimulation   Electrical Stimulation Location lumber   Electrical Stimulation Action interferential   Electrical Stimulation Parameters 15 min   Electrical Stimulation Goals Pain   Ultrasound   Ultrasound Location low back   Ultrasound Parameters 1.3 w/cm cont to bilateral lumbar paraspinals x 8 minutes   Ultrasound Goals Pain                PT Education - 04/13/14 0816    Education provided Yes   Education Details seated rotation   Person(s) Educated Patient   Methods Explanation;Demonstration;Handout   Comprehension Verbalized understanding          PT Short Term Goals - 04/13/14 0827    PT SHORT TERM GOAL #1   Title be independent with initial HEP   Time 4   Period Weeks   Status On-going  independent in HEP issued at evaluation   PT SHORT TERM GOAL #2   Title report < or = to  5/10 LBP with work tasks   Time 4   Period Weeks   Status On-going  5-7/10 and frequent rest breaks taken           PT Long Term Goals - 04/08/14 0931    PT LONG TERM GOAL #1   Title be independent in advanced HEP   Time 8   Period Weeks   PT LONG TERM GOAL #2   Title reduce FOTO to < or = to 29% limitation   Time 8   Period Weeks   Status New   PT LONG TERM GOAL #3   Title report < or = to 3/10 LBP at the end of a work shift   Time 8   Period Weeks   Status New   PT LONG TERM GOAL #4   Title report a 60% reduction in LBP with ADLs and self-care   Time 8   Period Weeks   Status New               Plan - 04/13/14 [id number]    Clinical Impression Statement Pt with reduced pain and is tolerating new HEP well.  Pt with only 1 session after evaluation so  minimal progress toward.   Pt will benefit from skilled therapeutic intervention in order to improve on the following deficits Impaired flexibility;Pain;Decreased endurance;Decreased activity tolerance   Rehab Potential Good   PT Frequency 2x / week   PT Duration 8 weeks   PT Treatment/Interventions ADLs/Self Care Home Management;Electrical Stimulation;Therapeutic exercise;Patient/family education;Moist Heat;Manual techniques;Neuromuscular re-education;Cryotherapy;Ultrasound;Therapeutic activities   PT Next Visit Plan Body mechanics education, hip and lumbar flexibility, core stab, modalities   PT Home Exercise Plan add prayer stretch to HEP   Consulted and Agree with Plan of Care Patient        Problem List Patient Active Problem List   Diagnosis Date Noted  . MDD (major depressive disorder), recurrent severe, without psychosis 04/09/2013  . Suicidal ideations 04/08/2013  . Overdose 04/08/2013    ,, PT 04/13/2014, 8:42 AM  [facility] [address], [address] [place], [place], [address] Phone: [phone]   Fax:  [phone]

## 2014-04-13 NOTE — Patient Instructions (Signed)
Cervico-Thoracic: Extension / Rotation (Sitting)   Reach across body with left arm and grasp back of chair. Gently look over right side shoulder. Hold _20___ seconds. Relax. Repeat 3____ times per set. Do __1__ sets per session. Do __3__ sessions per day.  http://orth.exer.us/981   Copyright  VHI. All rights reserved.    Bring both knees in to chest. Rotate from side to side, keeping knees together and feet off floor. Repeat ____ times per set. Do ____ sets per session. Do ____ sessions per day.  http://orth.exer.us/151   Copyright  VHI. All rights reserved.

## 2014-04-16 ENCOUNTER — Ambulatory Visit: Payer: BLUE CROSS/BLUE SHIELD | Admitting: Physical Therapy

## 2014-04-16 ENCOUNTER — Encounter: Payer: Self-pay | Admitting: Physical Therapy

## 2014-04-16 DIAGNOSIS — M5441 Lumbago with sciatica, right side: Secondary | ICD-10-CM

## 2014-04-16 DIAGNOSIS — M6281 Muscle weakness (generalized): Secondary | ICD-10-CM

## 2014-04-16 NOTE — Therapy (Addendum)
[facility] [address], [place], [place], [address] Phone: [phone]   Fax:  [phone]  Physical Therapy Treatment  Patient Details  Name: Rebecca Clay MRN: [id number] Date of Birth: December 06, 1990 Referring Provider:  Jonathon Bellows, MD  Encounter Date: 04/16/2014      PT End of Session - 04/16/14 0920    Visit Number 3   Date for PT Re-Evaluation 05/27/14   PT Start Time 0843   PT Stop Time 0945   PT Time Calculation (min) 62 min   Activity Tolerance Patient tolerated treatment well   Behavior During Therapy [facility] for tasks assessed/performed      Past Medical History  Diagnosis Date  . Anxiety   . Mental disorder   . Depression     Past Surgical History  Procedure Laterality Date  . Knee arthroscopy with anterior cruciate ligament (acl) repair Right 2007    LMP 12/27/2013  Visit Diagnosis:  Bilateral low back pain with right-sided sciatica  Weakness of trunk musculature      Subjective Assessment - 04/16/14 0844    Symptoms Back feels stiff this AM.    Currently in Pain? Yes   Pain Score 5    Pain Location Back   Pain Orientation Right;Left;Lower   Pain Descriptors / Indicators Sore   Aggravating Factors  Bending and lifting work duties, Investment banker, operational around in certain ways   Pain Relieving Factors Laying on stomach, ice, heat, exercises   Multiple Pain Sites No                    [facility] Adult PT Treatment/Exercise - 04/16/14 0001    Lumbar Exercises: Stretches   Single Knee to Chest Stretch 3 reps;20 seconds   Double Knee to Chest Stretch 3 reps;20 seconds   Lower Trunk Rotation 3 reps;20 seconds  Pt with good return demo of all HEP   Hip Flexor Stretch 2 reps;30 seconds  Bilateral   Lumbar Exercises: Supine   Bridge 10 reps;5 seconds   Lumbar Exercises: Quadruped   Madcat/Old Horse 10 reps  Verbal cues to engage abdominals    Single Arm Raise --  3x alternating arms with verbal  cues to keep abs engaged   Straight Leg Raise 5 reps;2 seconds   Straight Leg Raises Limitations Verbal and tactile cues for position of pelvis, pt very weak in this exercise   Opposite Arm/Leg Raise --  3x each side   Other Quadruped Lumbar Exercises prayer stretch 3x20 sec.   Knee/Hip Exercises: Aerobic   Stationary Bike L2 x10 min with review of status   Moist Heat Therapy   Number Minutes Moist Heat 15 Minutes   Moist Heat Location Other (comment)  lumber   Electrical Stimulation   Electrical Stimulation Location lumbar   Electrical Stimulation Action IFC   Electrical Stimulation Parameters 2mn   Electrical Stimulation Goals Pain                PT Education - 04/16/14 0910    Education Details Also added quadruped stretching to HEP          PT Short Term Goals - 04/16/14 0930    PT SHORT TERM GOAL #1   Title be independent with initial HEP   Time 4   Period Weeks   Status Achieved   PT SHORT TERM GOAL #2   Title report < or = to 5/10 LBP with work tasks   Period [employer]  Status On-going  Can go higher than 5/10 at times.    PT SHORT TERM GOAL #3   Title report a 30% reduction in LBP with ADLs and self-care   Time 4   Period Weeks   Status On-going  Fairly early, no change.           PT Long Term Goals - 04/16/14 0931    PT LONG TERM GOAL #1   Title be independent in advanced HEP   Time 8   Period Weeks   Status On-going  working towards   PT LONG TERM GOAL #2   Title reduce FOTO to < or = to 29% limitation   Time 8   Period Weeks   Status On-going  Will do on visit 10.   PT LONG TERM GOAL #3   Title report < or = to 3/10 LBP at the end of a work shift   Time 8   Period Weeks   Status On-going   PT LONG TERM GOAL #4   Title report a 60% reduction in LBP with ADLs and self-care   Time 8   Period Weeks   Status On-going               Plan - 04/16/14 0920    Clinical Impression Statement Patient demonstrates weaknes during  core exercises today. They did not increase pain.    Pt will benefit from skilled therapeutic intervention in order to improve on the following deficits Impaired flexibility;Pain;Decreased endurance;Decreased activity tolerance   Rehab Potential Good   PT Frequency 2x / week   PT Duration 8 weeks   PT Next Visit Plan Continue with core strength, aerobic conditioning   PT Home Exercise Plan Added prayer stretch today   Consulted and Agree with Plan of Care Patient        Problem List Patient Active Problem List   Diagnosis Date Noted  . MDD (major depressive disorder), recurrent severe, without psychosis 04/09/2013  . Suicidal ideations 04/08/2013  . Overdose 04/08/2013    COCHRAN,JENNIFER, PTA 04/16/2014, 9:33 AM  [facility] [address] [place], [place], [address] Phone: [phone]   Fax:  [phone]  PHYSICAL THERAPY DISCHARGE SUMMARY  Visits from Start of Care: 3  Current functional level related to goals / functional outcomes: Pt didn't return to PT after 04/16/14.  Goals and outcomes not assessed.     Remaining deficits: Unknown as pt didn't return.     Education / Equipment: HEP, Biomedical scientist Plan: Patient agrees to discharge.  Patient goals were not met. Patient is being discharged due to not returning since the last visit.  ?????    Sigurd Sos, PT 05/27/2014 12:19 PM   Patient was verbally educated in lifting techniques and ADL body mecanics that are lumbar protective. Handout given. Patient verbally understood principles.

## 2014-04-16 NOTE — Patient Instructions (Addendum)
Sleeping on Back  Place pillow under knees. A pillow with cervical support and a roll around waist are also helpful. Copyright  VHI. All rights reserved.  Sleeping on Side Place pillow between knees. Use cervical support under neck and a roll around waist as needed. Copyright  VHI. All rights reserved.   Sleeping on Stomach   If this is the only desirable sleeping position, place pillow under lower legs, and under stomach or chest as needed.  Posture - Sitting   Sit upright, head facing forward. Try using a roll to support lower back. Keep shoulders relaxed, and avoid rounded back. Keep hips level with knees. Avoid crossing legs for long periods. Stand to Sit / Sit to Stand   To sit: Bend knees to lower self onto front edge of chair, then scoot back on seat. To stand: Reverse sequence by placing one foot forward, and scoot to front of seat. Use rocking motion to stand up.   Work Height and Reach  Ideal work height is no more than 2 to 4 inches below elbow level when standing, and at elbow level when sitting. Reaching should be limited to arm's length, with elbows slightly bent.  Bending  Bend at hips and knees, not back. Keep feet shoulder-width apart.    Posture - Standing   Good posture is important. Avoid slouching and forward head thrust. Maintain curve in low back and align ears over shoul- ders, hips over ankles.  Alternating Positions   Alternate tasks and change positions frequently to reduce fatigue and muscle tension. Take rest breaks. Computer Work   Position work to face forward. Use proper work and seat height. Keep shoulders back and down, wrists straight, and elbows at right angles. Use chair that provides full back support. Add footrest and lumbar roll as needed.  Getting Into / Out of Car  Lower self onto seat, scoot back, then bring in one leg at a time. Reverse sequence to get out.  Dressing  Lie on back to pull socks or slacks over feet, or sit  and bend leg while keeping back straight.    Housework - Sink  Place one foot on ledge of cabinet under sink when standing at sink for prolonged periods.   Pushing / Pulling  Pushing is preferable to pulling. Keep back in proper alignment, and use leg muscles to do the work.  Deep Squat   Squat and lift with both arms held against upper trunk. Tighten stomach muscles without holding breath. Use smooth movements to avoid jerking.  Avoid Twisting   Avoid twisting or bending back. Pivot around using foot movements, and bend at knees if needed when reaching for articles.  Carrying Luggage   Distribute weight evenly on both sides. Use a cart whenever possible. Do not twist trunk. Move body as a unit.   Lifting Principles .Maintain proper posture and head alignment. .Slide object as close as possible before lifting. .Move obstacles out of the way. .Test before lifting; ask for help if too heavy. .Tighten stomach muscles without holding breath. .Use smooth movements; do not jerk. .Use legs to do the work, and pivot with feet. .Distribute the work load symmetrically and close to the center of trunk. .Push instead of pull whenever possible.   Ask For Help   Ask for help and delegate to others when possible. Coordinate your movements when lifting together, and maintain the low back curve.  Log Roll   Lying on back, bend left knee and place left   arm across chest. Roll all in one movement to the right. Reverse to roll to the left. Always move as one unit. Housework - Sweeping  Use long-handled equipment to avoid stooping.   Housework - Wiping  Position yourself as close as possible to reach work surface. Avoid straining your back.  Laundry - Unloading Wash   To unload small items at bottom of washer, lift leg opposite to arm being used to reach.  Gardening - Raking  Move close to area to be raked. Use arm movements to do the work. Keep back straight and avoid  twisting.     Cart  When reaching into cart with one arm, lift opposite leg to keep back straight.   Getting Into / Out of Bed  Lower self to lie down on one side by raising legs and lowering head at the same time. Use arms to assist moving without twisting. Bend both knees to roll onto back if desired. To sit up, start from lying on side, and use same move-ments in reverse. Housework - Vacuuming  Hold the vacuum with arm held at side. Step back and forth to move it, keeping head up. Avoid twisting.   Laundry - Armed forces training and education officerLoading Wash  Position laundry basket so that bending and twisting can be avoided.   Laundry - Unloading Dryer  Squat down to reach into clothes dryer or use a reacher.  Gardening - Weeding / Psychiatric nurselanting  Squat or Kneel. Knee pads may be helpful.                  Angry Cat, All Fours   Kneel on hands and knees. Tuck chin and tighten stomach. Exhale and round back upward. Inhale and arch back downward. Hold each position __5_ seconds. Repeat __10_ times per session. Do __1-3_ sessions per day.  Copyright  VHI. All rights reserved.      Copyright  VHI. All rights reserved.       Flexion   Sitting on knees, fold body over legs and relax head and arms on floor. Extend arms as far forward as possible. Hold __20__ seconds. Repeat _3___ times. Do __1-3_ sessions per day.  Copyright  VHI. All rights reserved.

## 2014-04-26 ENCOUNTER — Ambulatory Visit: Payer: BLUE CROSS/BLUE SHIELD | Admitting: Physical Therapy

## 2015-03-02 ENCOUNTER — Other Ambulatory Visit: Payer: Self-pay | Admitting: Family Medicine

## 2015-03-02 DIAGNOSIS — N63 Unspecified lump in unspecified breast: Secondary | ICD-10-CM

## 2015-03-02 DIAGNOSIS — N644 Mastodynia: Secondary | ICD-10-CM

## 2015-03-09 ENCOUNTER — Ambulatory Visit
Admission: RE | Admit: 2015-03-09 | Discharge: 2015-03-09 | Disposition: A | Discharge status: Home / Self Care | Payer: 59 | Source: Ambulatory Visit | Attending: Family Medicine | Admitting: Family Medicine

## 2015-03-09 DIAGNOSIS — N63 Unspecified lump in unspecified breast: Secondary | ICD-10-CM

## 2015-03-09 DIAGNOSIS — N644 Mastodynia: Secondary | ICD-10-CM

## 2015-07-08 ENCOUNTER — Encounter: Payer: Self-pay | Admitting: Family Medicine

## 2015-07-15 ENCOUNTER — Other Ambulatory Visit: Payer: Self-pay | Admitting: Family Medicine

## 2015-07-15 DIAGNOSIS — R51 Headache: Principal | ICD-10-CM

## 2015-07-15 DIAGNOSIS — R519 Headache, unspecified: Secondary | ICD-10-CM

## 2015-07-22 ENCOUNTER — Other Ambulatory Visit: Payer: Self-pay

## 2015-07-22 ENCOUNTER — Other Ambulatory Visit: Payer: Self-pay | Admitting: Family Medicine

## 2015-07-22 DIAGNOSIS — R519 Headache, unspecified: Secondary | ICD-10-CM

## 2015-07-22 DIAGNOSIS — R51 Headache: Principal | ICD-10-CM

## 2015-08-01 ENCOUNTER — Ambulatory Visit
Admission: RE | Admit: 2015-08-01 | Discharge: 2015-08-01 | Disposition: A | Discharge status: Home / Self Care | Payer: 59 | Source: Ambulatory Visit | Attending: Family Medicine | Admitting: Family Medicine

## 2015-08-01 DIAGNOSIS — R51 Headache: Principal | ICD-10-CM

## 2015-08-01 DIAGNOSIS — R519 Headache, unspecified: Secondary | ICD-10-CM

## 2015-08-01 MED ORDER — IOPAMIDOL (ISOVUE-300) INJECTION 61%
75.0000 mL | Freq: Once | INTRAVENOUS | Status: AC | PRN
  Administered 2015-08-01: 75 mL via INTRAVENOUS

## 2015-12-30 ENCOUNTER — Encounter: Payer: Self-pay | Admitting: Family Medicine

## 2016-01-23 IMAGING — CR DG CHEST 2V
2 series · 2 of 2 positions shown · non-contrast
Comparison: None.

CLINICAL DATA: Chest pain

EXAM:
CHEST  2 VIEW

[view not recorded (1 of 2)]
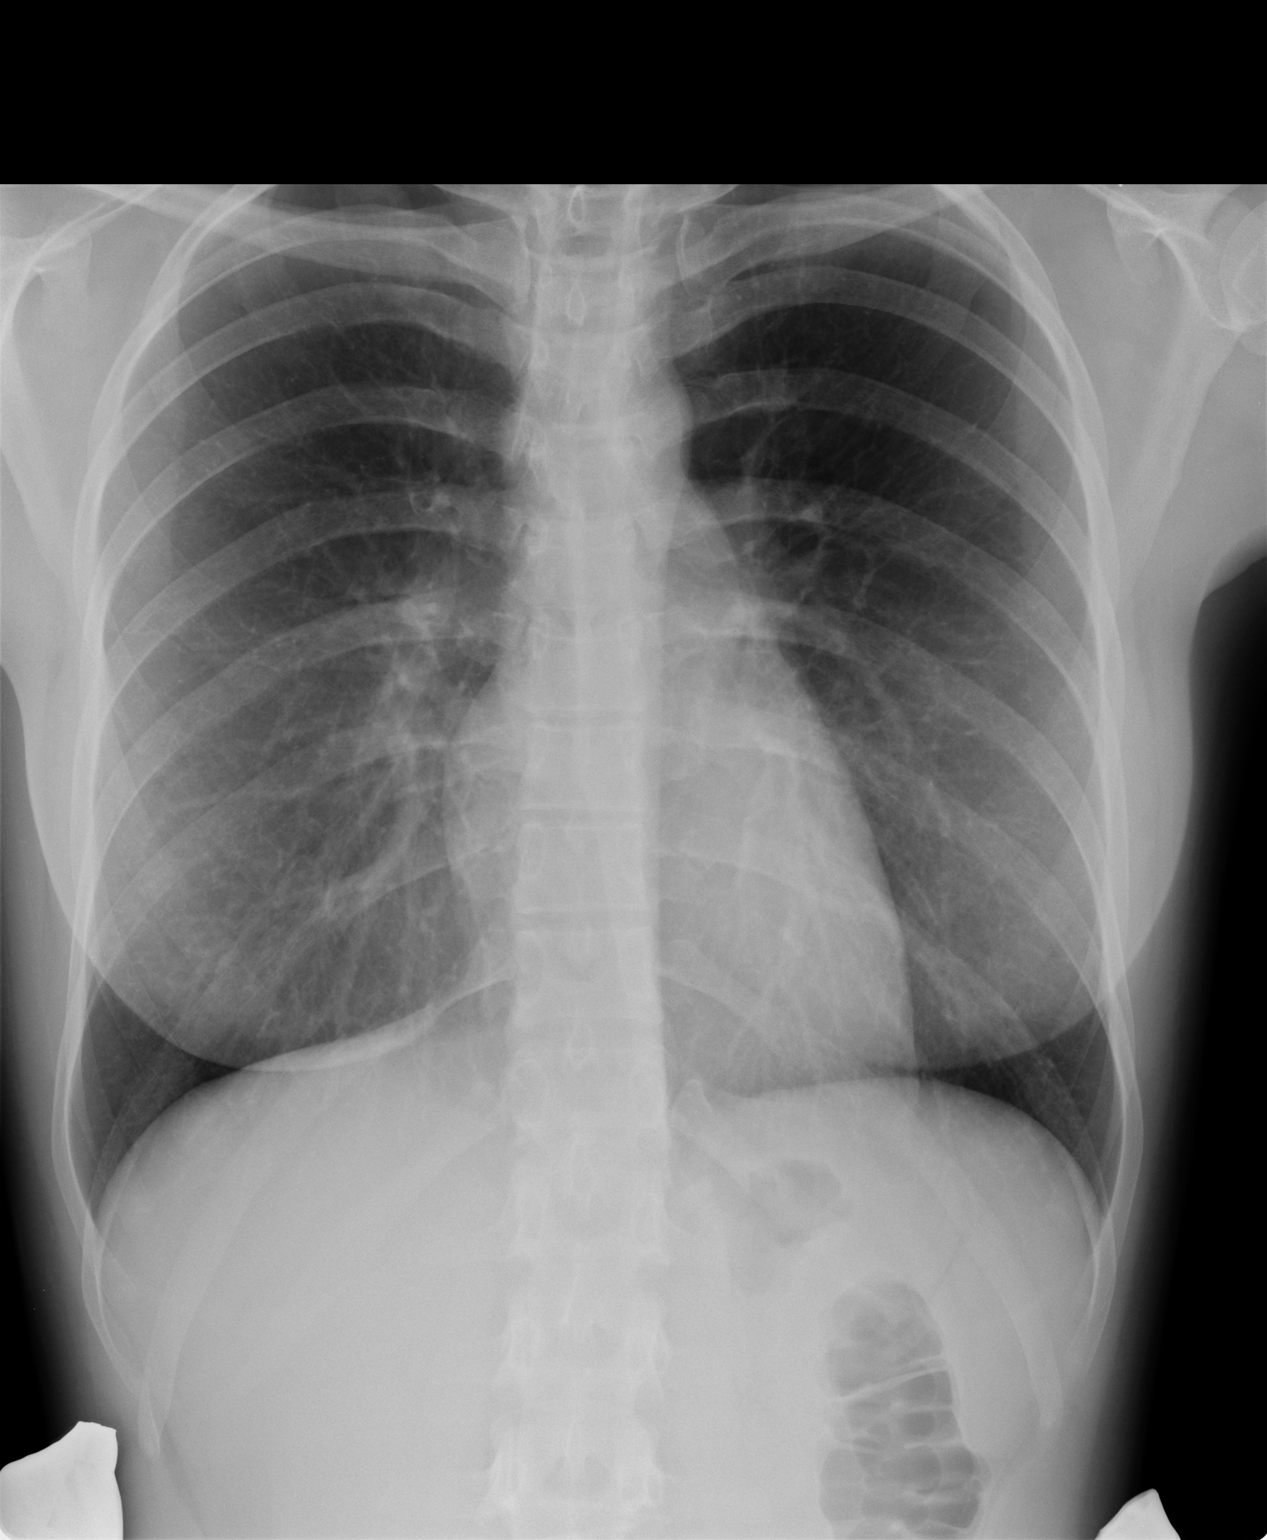

[view not recorded (2 of 2)]
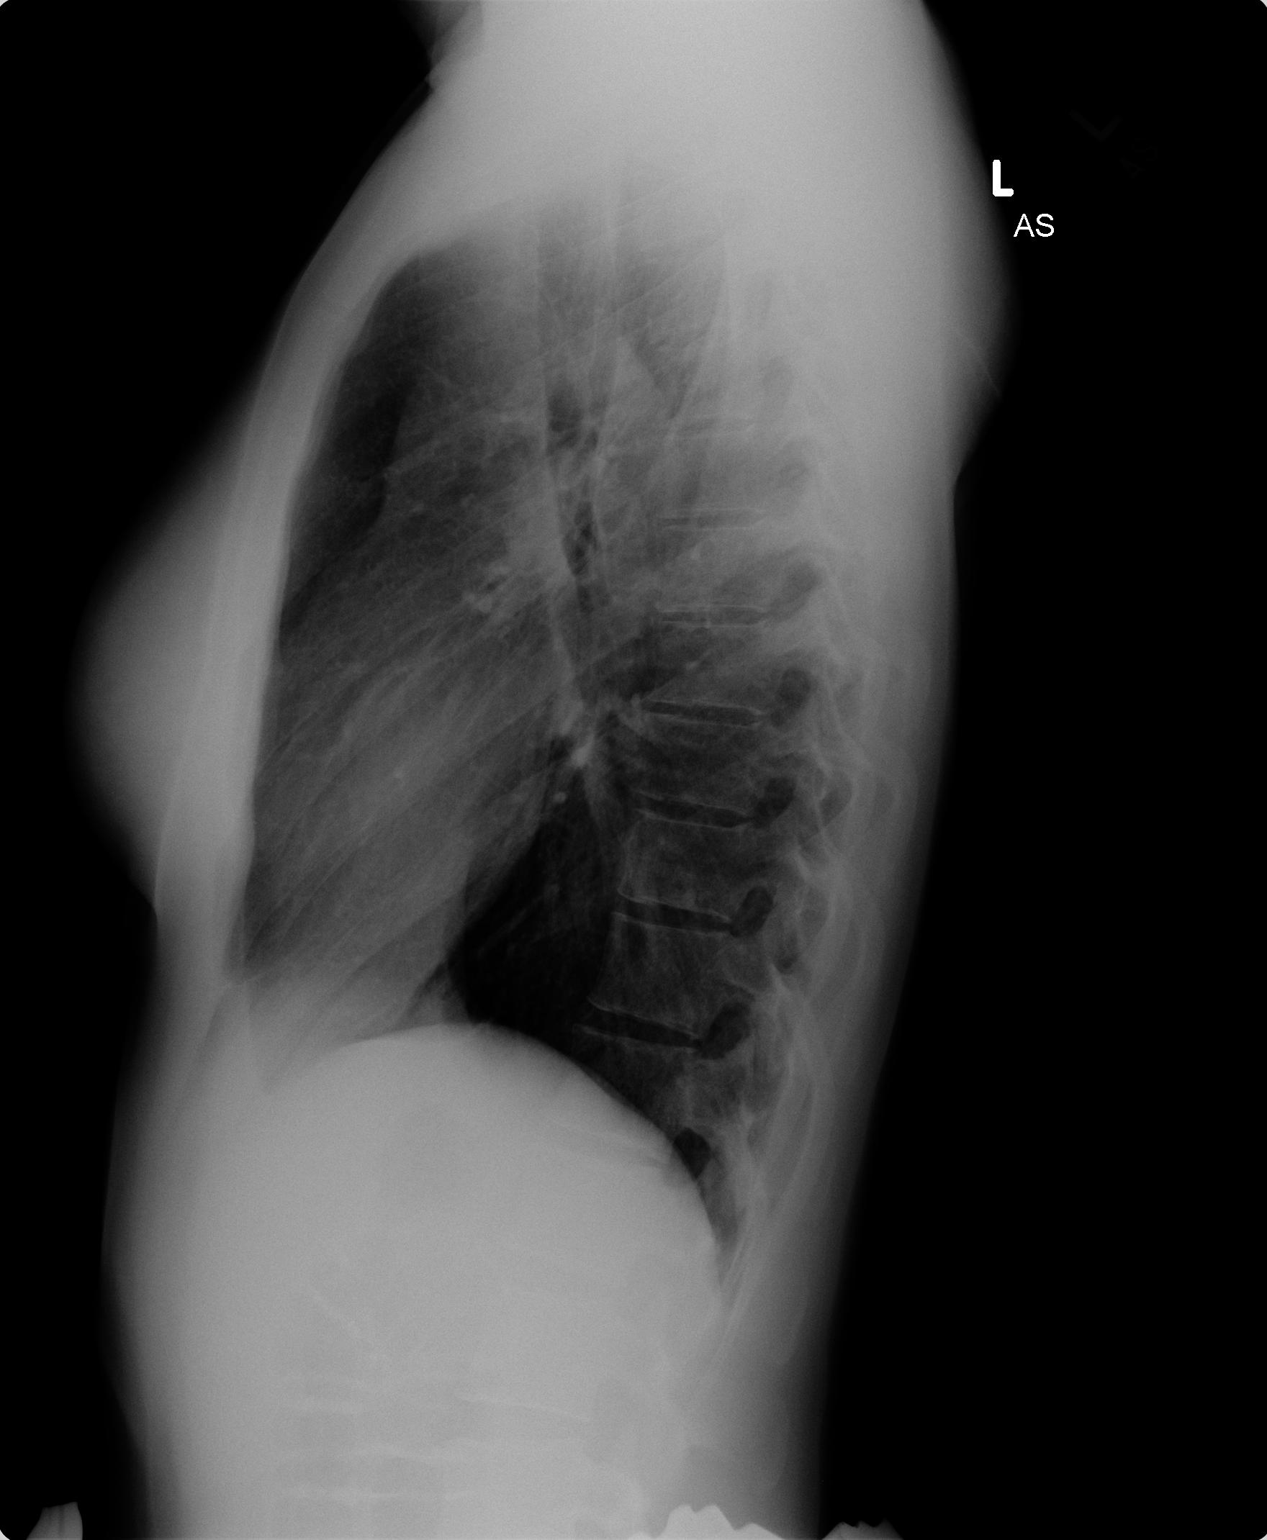

[2 of 2 positions shown; findings below may reference images not displayed]

FINDINGS: The heart size and mediastinal contours are within normal limits.
Both lungs are clear. No pneumothorax or pleural effusion. The
visualized skeletal structures are unremarkable.
IMPRESSION: Normal chest radiographs.

## 2017-01-02 ENCOUNTER — Encounter (HOSPITAL_COMMUNITY): Payer: Self-pay

## 2017-01-02 ENCOUNTER — Emergency Department (HOSPITAL_COMMUNITY): Payer: 59

## 2017-01-02 ENCOUNTER — Other Ambulatory Visit: Payer: Self-pay

## 2017-01-02 ENCOUNTER — Emergency Department (HOSPITAL_COMMUNITY)
Admission: EM | Admit: 2017-01-02 | Discharge: 2017-01-02 | Disposition: A | Discharge status: Home / Self Care | Payer: 59 | Attending: Physician Assistant | Admitting: Physician Assistant

## 2017-01-02 DIAGNOSIS — Z91010 Allergy to peanuts: Secondary | ICD-10-CM | POA: Insufficient documentation

## 2017-01-02 DIAGNOSIS — R079 Chest pain, unspecified: Secondary | ICD-10-CM | POA: Insufficient documentation

## 2017-01-02 LAB — I-STAT TROPONIN, ED: TROPONIN I, POC: 0 ng/mL (ref 0.00–0.08)

## 2017-01-02 LAB — CBC
HEMATOCRIT: 45.4 % (ref 39.0–52.0)
HEMOGLOBIN: 15.7 g/dL (ref 13.0–17.0)
MCH: 29.9 pg (ref 26.0–34.0)
MCHC: 34.6 g/dL (ref 30.0–36.0)
MCV: 86.5 fL (ref 78.0–100.0)
Platelets: 211 10*3/uL (ref 150–400)
RBC: 5.25 MIL/uL (ref 4.22–5.81)
RDW: 13.7 % (ref 11.5–15.5)
WBC: 5.1 10*3/uL (ref 4.0–10.5)

## 2017-01-02 LAB — BASIC METABOLIC PANEL
ANION GAP: 6 (ref 5–15)
BUN: 7 mg/dL (ref 6–20)
CALCIUM: 9.3 mg/dL (ref 8.9–10.3)
CO2: 29 mmol/L (ref 22–32)
Chloride: 102 mmol/L (ref 101–111)
Creatinine, Ser: 0.71 mg/dL (ref 0.61–1.24)
GLUCOSE: 90 mg/dL (ref 65–99)
POTASSIUM: 3.4 mmol/L — AB (ref 3.5–5.1)
SODIUM: 137 mmol/L (ref 135–145)

## 2017-01-02 LAB — D-DIMER, QUANTITATIVE (NOT AT ARMC)

## 2017-01-02 LAB — LIPASE, BLOOD: LIPASE: 27 U/L (ref 11–51)

## 2017-01-02 LAB — HEPATIC FUNCTION PANEL
ALBUMIN: 4 g/dL (ref 3.5–5.0)
ALT: 16 U/L — ABNORMAL LOW (ref 17–63)
AST: 18 U/L (ref 15–41)
Alkaline Phosphatase: 86 U/L (ref 38–126)
BILIRUBIN DIRECT: 0.3 mg/dL (ref 0.1–0.5)
BILIRUBIN TOTAL: 2.4 mg/dL — AB (ref 0.3–1.2)
Indirect Bilirubin: 2.1 mg/dL — ABNORMAL HIGH (ref 0.3–0.9)
Total Protein: 6.8 g/dL (ref 6.5–8.1)

## 2017-01-02 MED ORDER — CYCLOBENZAPRINE HCL 10 MG PO TABS: 10.0000 mg | ORAL_TABLET | Freq: Two times a day (BID) | ORAL | 0 refills | Status: DC | PRN

## 2017-01-02 MED ORDER — IBUPROFEN 800 MG PO TABS: 800.0000 mg | ORAL_TABLET | Freq: Three times a day (TID) | ORAL | 0 refills | Status: AC

## 2017-01-02 NOTE — ED Triage Notes (Signed)
Pt states chest pain that started a week ago. Primarily central radiating into the right chest area. Pt states the pain radiates around to the back and is painful to breathe.

## 2017-01-02 NOTE — ED Provider Notes (Signed)
[facility] EMERGENCY [facility] Provider Note   CSN: [id number] Arrival date & time: 01/02/17  [id number]     History   Chief Complaint Chief Complaint  Patient presents with  . Chest Pain    HPI Rebecca Clay is a 26 y.o. female.  HPI   Patient is a 26 year old trans-female.  He is been taking testosterone for over a year.  No surgeries.  Patient presenting with chest pain radiating to his back.  Patient reports is worse with deep breaths.  Patient is on injectable testosterone once a week.  Patient reports not associated with exertion, not associated with food.  Mild shortness of breath.  Patient uses asthma inhaler for exercise-induced asthma.  Otherwise healthy.  No recent travel.  No leg swelling.  History reviewed. No pertinent past medical history.  There are no active problems to display for this patient.   History reviewed. No pertinent surgical history.     Home Medications    Prior to Admission medications   Not on File    Family History History reviewed. No pertinent family history.  Social History Social History   Tobacco Use  . Smoking status: Never Smoker  . Smokeless tobacco: Never Used  Substance Use Topics  . Alcohol use: No    Frequency: Never  . Drug use: Not on file     Allergies   Peanut-containing drug products and Pollen extract   Review of Systems Review of Systems  Constitutional: Negative for activity change, fatigue and fever.  Respiratory: Positive for chest tightness and shortness of breath.   Cardiovascular: Positive for chest pain. Negative for palpitations and leg swelling.  Gastrointestinal: Negative for abdominal pain.  All other systems reviewed and are negative.    Physical Exam Updated Vital Signs BP 120/80   Pulse 75   Resp (!) 23   SpO2 99%   Physical Exam  Constitutional: He is oriented to person, place, and time. He appears well-nourished.  HENT:  Head: Normocephalic.  Eyes: Conjunctivae  are normal.  Cardiovascular: Normal rate, regular rhythm, intact distal pulses and normal pulses.  Pulmonary/Chest: Effort normal and breath sounds normal. No accessory muscle usage. No respiratory distress.  Neurological: He is oriented to person, place, and time.  Skin: Skin is warm and dry. He is not diaphoretic.  Psychiatric: He has a normal mood and affect. His behavior is normal.     ED Treatments / Results  Labs (all labs ordered are listed, but only abnormal results are displayed) Labs Reviewed  BASIC METABOLIC PANEL - Abnormal; Notable for the following components:      Result Value   Potassium 3.4 (*)    All other components within normal limits  CBC  D-DIMER, QUANTITATIVE (NOT AT [facility])  I-STAT TROPONIN, ED    EKG  EKG Interpretation None       Radiology Dg Chest 2 View  Result Date: 01/02/2017 CLINICAL DATA:  Shortness of breath. EXAM: CHEST  2 VIEW COMPARISON:  None. FINDINGS: The heart size and mediastinal contours are within normal limits. Both lungs are clear. No pneumothorax or pleural effusion is noted. The visualized skeletal structures are unremarkable. IMPRESSION: No active cardiopulmonary disease. Electronically Signed   By: Lupita RaiderJames  Green Jr, M.D.   On: 01/02/2017 10:16    Procedures Procedures (including critical care time)  Medications Ordered in ED Medications - No data to display   Initial Impression / Assessment and Plan / ED Course  I have reviewed the triage vital  signs and the nursing notes.  Pertinent labs & imaging results that were available during my care of the patient were reviewed by me and considered in my medical decision making (see chart for details).     Patient is a 26 year old trans-female.  He is been taking testosterone for over a year.  No surgeries.  Patient presenting with chest pain radiating to his back.  Patient reports is worse with deep breaths.  Patient is on injectable testosterone once a week.  Patient reports not  associated with exertion, not associated with food.  Mild shortness of breath.  Patient uses asthma inhaler for exercise-induced asthma.  Otherwise healthy.  No recent travel.  No leg swelling.  12:59 PM Suspect costochondritis.  However given the use of injectable testosterone, will do d-dimer.  D dimer negative.   Normal vitals, will dc home.   Final Clinical Impressions(s) / ED Diagnoses   Final diagnoses:  None    ED Discharge Orders    None       Abelino DerrickMackuen,  Lyn, MD 01/03/17 2312

## 2017-01-02 NOTE — Discharge Instructions (Signed)
We have great news is that your labs and test results are all negative.  It is possible that you have a sprain of muscle in your chest wall.  Please pay attention to symptoms and return with any concerns.

## 2017-04-24 ENCOUNTER — Encounter: Payer: Self-pay | Admitting: Family Medicine

## 2017-10-29 ENCOUNTER — Ambulatory Visit: Payer: Self-pay | Admitting: Physician Assistant

## 2017-11-06 ENCOUNTER — Encounter: Payer: Self-pay | Admitting: Family Medicine

## 2017-11-06 ENCOUNTER — Ambulatory Visit: Payer: 59 | Admitting: Family Medicine

## 2017-11-06 ENCOUNTER — Other Ambulatory Visit: Payer: Self-pay

## 2017-11-06 VITALS — BP 121/74 | HR 87 | Temp 98.2°F | Ht 69.0 in | Wt 149.0 lb

## 2017-11-06 DIAGNOSIS — F640 Transsexualism: Secondary | ICD-10-CM

## 2017-11-06 DIAGNOSIS — Z79899 Other long term (current) drug therapy: Secondary | ICD-10-CM | POA: Diagnosis not present

## 2017-11-06 DIAGNOSIS — Z7952 Long term (current) use of systemic steroids: Secondary | ICD-10-CM | POA: Insufficient documentation

## 2017-11-06 DIAGNOSIS — Z789 Other specified health status: Secondary | ICD-10-CM | POA: Insufficient documentation

## 2017-11-06 MED ORDER — TESTOSTERONE CYPIONATE 200 MG/ML IM SOLN: 80.0000 mg | INTRAMUSCULAR | 2 refills | Status: DC

## 2017-11-06 NOTE — Patient Instructions (Signed)
° ° ° °  If you have lab work done today you will be contacted with your lab results within the next 2 weeks.  If you have not heard from us then please contact us. The fastest way to get your results is to register for My Chart. ° ° °IF you received an x-ray today, you will receive an invoice from [facility]. Please contact [facility] at [phone] with questions or concerns regarding your invoice.  ° °IF you received labwork today, you will receive an invoice from [facility]. Please contact [facility] at [phone] with questions or concerns regarding your invoice.  ° °Our billing staff will not be able to assist you with questions regarding bills from these companies. ° °You will be contacted with the lab results as soon as they are available. The fastest way to get your results is to activate your My Chart account. Instructions are located on the last page of this paperwork. If you have not heard from us regarding the results in 2 weeks, please contact this office. °  ° ° ° °

## 2017-11-06 NOTE — Progress Notes (Signed)
9/25/20195:16 PM  Rebecca Clay Mar 23, 1990, 27 y.o. adult [id number]  Chief Complaint  Patient presents with  . Establish Care    medication management take the testosterone injection 1x a wk. Will need refills on testosterone    HPI:   Patient is a 27 y.o. adult with past medical history significant for F>M who presents today for establishing transgender care  Previous patient of Dr Domingo Sep, last appt about 6 months ago Started transitioning in 2017 Taking testosterone 0.4mg  IM weekly Last dose was almost a month ago Denies any side effects Has achieved amenorrhea No surgeries Not going to counseling currently, does not feel a need  Works full time at [employer], Chartered loss adjuster Married (female partner), gives injections  Sees Dr Hyman Hopes at [facility], pap UTD Sees Dr Toni Arthurs for ADHD  Fall Risk  11/06/2017  Falls in the past year? No     Depression screen PHQ 2/9 11/06/2017  Decreased Interest 0  Down, Depressed, Hopeless 0  PHQ - 2 Score 0    Allergies  Allergen Reactions  . Bee Pollen Cough  . Peanut-Containing Drug Products     Pt avoids all nuts  . Pollen Extract Cough    Prior to Admission medications   Medication Sig Start Date End Date Taking? Authorizing Provider  albuterol (PROVENTIL HFA;VENTOLIN HFA) 108 (90 Base) MCG/ACT inhaler Inhale into the lungs every 6 (six) hours as needed for wheezing or shortness of breath.   Yes [provider]  cyclobenzaprine (FLEXERIL) 10 MG tablet Take 1 tablet (10 mg total) by mouth 2 (two) times daily as needed for muscle spasms. 01/02/17  Yes Mackuen, Courteney Lyn, MD  ibuprofen (ADVIL,MOTRIN) 800 MG tablet Take 1 tablet (800 mg total) by mouth 3 (three) times daily. 01/02/17  Yes Mackuen, Courteney Lyn, MD  methylphenidate 54 MG PO CR tablet Take 54 mg by mouth every morning.   Yes [provider]  omeprazole (PRILOSEC) 40 MG capsule Take 40 mg by mouth daily.   Yes [provider]     Past Medical History:  Diagnosis Date  . Allergy   . Anxiety   . Asthma   . Depression     History reviewed. No pertinent surgical history.  Social History   Tobacco Use  . Smoking status: Never Smoker  . Smokeless tobacco: Never Used  Substance Use Topics  . Alcohol use: No    Frequency: Never    Family History  Problem Relation Age of Onset  . Healthy Mother   . Hyperlipidemia Father   . Hypertension Father   . Healthy Sister   . Hyperlipidemia Maternal Grandmother   . Cancer Maternal Grandfather   . Hyperlipidemia Maternal Grandfather   . Diabetes Paternal Grandfather   . Hyperlipidemia Paternal Grandfather     Review of Systems  Constitutional: Negative for chills and fever.  Respiratory: Negative for cough and shortness of breath.   Cardiovascular: Negative for chest pain, palpitations and leg swelling.  Gastrointestinal: Negative for abdominal pain, nausea and vomiting.     OBJECTIVE:  Blood pressure 121/74, pulse 87, temperature 98.2 F (36.8 C), temperature source Oral, height 5\' 9"  (1.753 m), weight 149 lb (67.6 kg), SpO2 98 %. Body mass index is 22 kg/m.   Physical Exam  Constitutional: He is oriented to person, place, and time. He appears well-developed and well-nourished.  HENT:  Head: Normocephalic and atraumatic.  Mouth/Throat: Oropharynx is clear and moist.  Eyes: Pupils are equal, round, and reactive to light.  Conjunctivae and EOM are normal.  Neck: Neck supple.  Cardiovascular: Normal rate and regular rhythm. Exam reveals no gallop and no friction rub.  No murmur heard. Pulmonary/Chest: Effort normal and breath sounds normal. He has no wheezes. He has no rales.  Musculoskeletal: He exhibits no edema.  Neurological: He is alert and oriented to person, place, and time.  Skin: Skin is warm and dry.  Psychiatric: He has a normal mood and affect.  Nursing note and vitals reviewed.   ASSESSMENT and PLAN  1. Female-to-female transgender  person 2. High risk medication use 3. Long term (current) use of systemic steroids - CBC with Differential/Platelet - Comprehensive metabolic panel - Lipid panel - Testosterone, Free, Total, SHBG - Estradiol  Patient here to establish transgender care. He reports being stable on current regime. Med refilled. Checking labs today, though I anticipate them not to be accurate as he has been wo testosterone for almost a month. [facility] Risks of Masculinizing Hormone Therapy reviewed with patient, all questions answered. May this serve as informed consent. A signed copy sent for scanning into patient's chart.  - testosterone cypionate (DEPOTESTOSTERONE CYPIONATE) 200 MG/ML injection; Inject 0.4 mLs (80 mg total) into the muscle once a week.  Return in about 3 months (around 02/05/2018) for Dr Clelia Croft.    Myles Lipps, MD Primary Care at [facility] [address] [place], [place] [address] Ph.  [phone] Fax [phone]

## 2017-11-07 LAB — COMPREHENSIVE METABOLIC PANEL
ALT: 9 IU/L (ref 0–32)
AST: 13 IU/L (ref 0–40)
Albumin/Globulin Ratio: 1.6 (ref 1.2–2.2)
Albumin: 4.5 g/dL (ref 3.5–5.5)
Alkaline Phosphatase: 82 IU/L (ref 39–117)
BUN/Creatinine Ratio: 14 (ref 9–23)
BUN: 10 mg/dL (ref 6–20)
Bilirubin Total: 1.6 mg/dL — ABNORMAL HIGH (ref 0.0–1.2)
CO2: 25 mmol/L (ref 20–29)
Calcium: 9.3 mg/dL (ref 8.7–10.2)
Chloride: 100 mmol/L (ref 96–106)
Creatinine, Ser: 0.74 mg/dL (ref 0.57–1.00)
GFR calc Af Amer: 127 mL/min/{1.73_m2} (ref >59)
GFR calc non Af Amer: 111 mL/min/{1.73_m2} (ref >59)
Globulin, Total: 2.8 g/dL (ref 1.5–4.5)
Glucose: 96 mg/dL (ref 65–99)
Potassium: 3.8 mmol/L (ref 3.5–5.2)
Sodium: 140 mmol/L (ref 134–144)
Total Protein: 7.3 g/dL (ref 6.0–8.5)

## 2017-11-07 LAB — CBC WITH DIFFERENTIAL/PLATELET
Basophils Absolute: 0 10*3/uL (ref 0.0–0.2)
Basos: 0 %
EOS (ABSOLUTE): 0.1 10*3/uL (ref 0.0–0.4)
Eos: 1 %
Hematocrit: 46.9 % — ABNORMAL HIGH (ref 34.0–46.6)
Hemoglobin: 15.7 g/dL (ref 11.1–15.9)
Immature Grans (Abs): 0 10*3/uL (ref 0.0–0.1)
Immature Granulocytes: 0 %
Lymphocytes Absolute: 2.2 10*3/uL (ref 0.7–3.1)
Lymphs: 27 %
MCH: 28.2 pg (ref 26.6–33.0)
MCHC: 33.5 g/dL (ref 31.5–35.7)
MCV: 84 fL (ref 79–97)
Monocytes Absolute: 0.3 10*3/uL (ref 0.1–0.9)
Monocytes: 4 %
Neutrophils Absolute: 5.5 10*3/uL (ref 1.4–7.0)
Neutrophils: 68 %
Platelets: 211 10*3/uL (ref 150–450)
RBC: 5.57 x10E6/uL — ABNORMAL HIGH (ref 3.77–5.28)
RDW: 13.3 % (ref 12.3–15.4)
WBC: 8.1 10*3/uL (ref 3.4–10.8)

## 2017-11-07 LAB — LIPID PANEL
Chol/HDL Ratio: 3.9 ratio (ref 0.0–4.4)
Cholesterol, Total: 163 mg/dL (ref 100–199)
HDL: 42 mg/dL (ref >39)
LDL Calculated: 94 mg/dL (ref 0–99)
Triglycerides: 135 mg/dL (ref 0–149)
VLDL Cholesterol Cal: 27 mg/dL (ref 5–40)

## 2017-11-07 LAB — TESTOSTERONE, FREE, TOTAL, SHBG
Sex Hormone Binding: 23.4 nmol/L — ABNORMAL LOW (ref 24.6–122.0)
Testosterone, Free: 1.7 pg/mL (ref 0.0–4.2)
Testosterone: 35 ng/dL (ref 8–48)

## 2017-11-07 LAB — ESTRADIOL: Estradiol: 108 pg/mL

## 2017-12-05 ENCOUNTER — Telehealth: Payer: Self-pay | Admitting: Family Medicine

## 2017-12-05 ENCOUNTER — Other Ambulatory Visit: Payer: Self-pay | Admitting: Family Medicine

## 2017-12-05 NOTE — Telephone Encounter (Signed)
Copied from CRM [id number]. Topic: General - Other >> Dec 05, 2017  9:20 AM Jaquita Rector A wrote: Reason for CRM: Patient called to request a change of the way his Rx is written so that he can get 2 vials of medication instead  of just one because it is inconvenient and he keep paying out of pocket for the extra meds.testosterone cypionate (DEPOTESTOSTERONE CYPIONATE) 200 MG/ML injection.  Ph# [phone]

## 2017-12-05 NOTE — Telephone Encounter (Signed)
Patient is requesting a refill of the following medications: Requested Prescriptions   Pending Prescriptions Disp Refills  . testosterone cypionate (DEPOTESTOSTERONE CYPIONATE) 200 MG/ML injection [Pharmacy Med Name: TESTOSTERONE CYP 200MG /ML INJ, 1ML] 1 mL 0    Sig: ADMINISTER 0.4 ML(80 MG) IN THE MUSCLE 1 TIME A WEEK    Date of patient request: 12/05/2017 Last office visit: 11/06/2017 Date of last refill: 11/06/2017  Last refill amount: 1 mL with 2 Refills Follow up time period per chart: appt with shaw on 01/27/18  Please advise. Dgaddy, CMA

## 2017-12-05 NOTE — Telephone Encounter (Signed)
Requested medication (s) are due for refill today:   Yes  Requested medication (s) are on the active medication list:   Yes  Future visit scheduled:   Yes 01/27/18 with Dr. Clelia Croft   Last ordered: 11/06/17  1 ml  2 refills   Requested Prescriptions  Pending Prescriptions Disp Refills   testosterone cypionate (DEPOTESTOSTERONE CYPIONATE) 200 MG/ML injection [Pharmacy Med Name: TESTOSTERONE CYP 200MG /ML INJ, 1ML] 1 mL 0    Sig: ADMINISTER 0.4 ML(80 MG) IN THE MUSCLE 1 TIME A WEEK     Off-Protocol Failed - 12/05/2017  2:54 PM      Failed - Medication not assigned to a protocol, review manually.      Passed - Valid encounter within last 12 months    Recent Outpatient Visits          4 weeks ago Female-to-female transgender person   [facility] at [facility], Meda Coffee, MD      Future Appointments            In 1 month Clelia Croft Levell July, MD Primary Care at [place], [facility]

## 2017-12-05 NOTE — Telephone Encounter (Signed)
   Patient requesting a change of the way his Rx is written so that he can get 2 vials of medication instead  of just one because it is inconvenient and he keep paying out of pocket for the extra meds.testosterone cypionate (DEPOTESTOSTERONE CYPIONATE) 200 MG/ML injection.  Ph# [phone]

## 2017-12-06 NOTE — Telephone Encounter (Signed)
Absolutely agree - in fact - would be happy to try to send in 1 10 ml vial or a 3 month supply of 6 vials if he prefers - went ahead and sent in the 2 ml with refills to make it to his appt w/ me on 12/14 but if pt prefers 1 10ml vial or a 3 mo supply - tell him not to pick up the current rx and send notice back to me so I can cancel and send in whatever is most convenient.

## 2017-12-09 NOTE — Telephone Encounter (Signed)
Pt came into the office today and states he already has picked up Rx but would like the next Rx to be written for 1 10 ml vial so he want have to pay out of pocket.

## 2017-12-09 NOTE — Telephone Encounter (Signed)
Message sent to Dr. Clelia Croft. Re: next rx

## 2017-12-13 NOTE — Telephone Encounter (Signed)
Will discuss w/ pt at his visit 12/14 - but I suspect with 1 10 ml vial he WILL have to pay out of pocket since that is over a 3 mos  Supply - his insurance i'm happy to try so at his visit will send in 10 ml vial.

## 2018-01-27 ENCOUNTER — Encounter: Payer: Self-pay | Admitting: Family Medicine

## 2018-01-27 ENCOUNTER — Ambulatory Visit: Payer: 59 | Admitting: Family Medicine

## 2018-01-27 VITALS — BP 122/73 | HR 72 | Temp 98.3°F | Resp 16 | Ht 68.0 in | Wt 149.4 lb

## 2018-01-27 DIAGNOSIS — Z7952 Long term (current) use of systemic steroids: Secondary | ICD-10-CM

## 2018-01-27 DIAGNOSIS — Z79899 Other long term (current) drug therapy: Secondary | ICD-10-CM

## 2018-01-27 DIAGNOSIS — Z789 Other specified health status: Secondary | ICD-10-CM

## 2018-01-27 DIAGNOSIS — F640 Transsexualism: Secondary | ICD-10-CM | POA: Diagnosis not present

## 2018-01-27 MED ORDER — TESTOSTERONE CYPIONATE 200 MG/ML IM SOLN: INTRAMUSCULAR | 1 refills | Status: AC

## 2018-01-27 NOTE — Progress Notes (Signed)
Subjective:    Patient: Rebecca Clay  DOB: 1990-11-08; 27 y.o.   MRN: [id number]  Chief Complaint  Patient presents with  . medication follow up    HPI this is my first appointment meeting Fredric Mare who established care at our clinic for masculinizing gender affirming hormone therapy 3 months ago with Dr. Leretha Pol and is transferring care to myself due to my special interest.  Started transition 2 years ago 2017 with Dr. Maudie Mercury who he last saw around March 2019. On  On testosterone 0.4 mg IM weekly but at last visit had been out of medication for the prior month.  Initially was started 0.6 q2wk then 0.8 q2wks but changed to 0.4 qwk.  Feels like he is well controlled on the weekly dosing, less irritable.   TDAP was done 2017 - was bit by cat  Married to a female partner.  Pap up-to-date with Dr. Hyman Hopes at [facility].  Follows with Dr. Toni Arthurs for ADHD.  Works at [employer].  Has enough needles/syruinges.  Does Wed but if forgets then does Thurs - wife does them - pt was initially giving them to himself but switched to topical - but now wife - does them.  But last injection was actually just Sat - 2d ago.  His wife alternates between left and right and on right side it tends to be much more painful.  Medical History Past Medical History:  Diagnosis Date  . Allergy   . Anxiety   . Asthma   . Depression    History reviewed. No pertinent surgical history. Current Outpatient Medications on File Prior to Visit  Medication Sig Dispense Refill  . loratadine (CLARITIN) 5 MG chewable tablet Chew 5 mg by mouth daily.    Marland Kitchen albuterol (PROVENTIL HFA;VENTOLIN HFA) 108 (90 Base) MCG/ACT inhaler Inhale into the lungs every 6 (six) hours as needed for wheezing or shortness of breath.    . cyclobenzaprine (FLEXERIL) 10 MG tablet Take 1 tablet (10 mg total) by mouth 2 (two) times daily as needed for muscle spasms. 10 tablet 0  . ibuprofen (ADVIL,MOTRIN) 800 MG tablet Take 1 tablet (800 mg total) by mouth  3 (three) times daily. 21 tablet 0  . methylphenidate 54 MG PO CR tablet Take 54 mg by mouth every morning.    Marland Kitchen omeprazole (PRILOSEC) 40 MG capsule Take 40 mg by mouth daily.    Marland Kitchen testosterone cypionate (DEPOTESTOSTERONE CYPIONATE) 200 MG/ML injection ADMINISTER 0.4 ML(80 MG) IN THE MUSCLE 1 TIME A WEEK 2 mL 1   No current facility-administered medications on file prior to visit.    Allergies  Allergen Reactions  . Bee Pollen Cough  . Peanut-Containing Drug Products     Pt avoids all nuts  . Pollen Extract Cough   Family History  Problem Relation Age of Onset  . Healthy Mother   . Hyperlipidemia Father   . Hypertension Father   . Healthy Sister   . Hyperlipidemia Maternal Grandmother   . Cancer Maternal Grandfather   . Hyperlipidemia Maternal Grandfather   . Diabetes Paternal Grandfather   . Hyperlipidemia Paternal Grandfather    Social History   Socioeconomic History  . Marital status: Married    Spouse name: Not on file  . Number of children: Not on file  . Years of education: Not on file  . Highest education level: Not on file  Occupational History  . Not on file  Social Needs  . Financial resource strain: Not on file  .  Food insecurity:    Worry: Not on file    Inability: Not on file  . Transportation needs:    Medical: Not on file    Non-medical: Not on file  Tobacco Use  . Smoking status: Never Smoker  . Smokeless tobacco: Never Used  Substance and Sexual Activity  . Alcohol use: No    Frequency: Never  . Drug use: Never  . Sexual activity: Not on file  Lifestyle  . Physical activity:    Days per week: Not on file    Minutes per session: Not on file  . Stress: Not on file  Relationships  . Social connections:    Talks on phone: Not on file    Gets together: Not on file    Attends religious service: Not on file    Active member of club or organization: Not on file    Attends meetings of clubs or organizations: Not on file    Relationship status:  Not on file  Other Topics Concern  . Not on file  Social History Narrative  . Not on file   Depression screen [facility] 2/9 01/27/2018 11/06/2017  Decreased Interest 0 0  Down, Depressed, Hopeless 0 0  PHQ - 2 Score 0 0    ROS As noted in HPI  Objective:  BP 122/73 (BP Location: Right Arm, Patient Position: Sitting, Cuff Size: Normal)   Pulse 72   Temp 98.3 F (36.8 C) (Oral)   Resp 16   Ht 5\' 8"  (1.727 m)   Wt 149 lb 6.4 oz (67.8 kg)   SpO2 99%   BMI 22.72 kg/m  Physical Exam Constitutional:      General: He is not in acute distress.    Appearance: He is well-developed. He is not diaphoretic.  HENT:     Head: Normocephalic and atraumatic.     Right Ear: External ear normal.     Left Ear: External ear normal.  Eyes:     General: No scleral icterus.    Conjunctiva/sclera: Conjunctivae normal.  Neck:     Musculoskeletal: Normal range of motion and neck supple.     Thyroid: No thyromegaly.  Cardiovascular:     Rate and Rhythm: Normal rate and regular rhythm.     Heart sounds: Normal heart sounds.  Pulmonary:     Effort: Pulmonary effort is normal. No respiratory distress.     Breath sounds: Normal breath sounds.  Lymphadenopathy:     Cervical: No cervical adenopathy.  Skin:    General: Skin is warm and dry.     Findings: No erythema.  Neurological:     Mental Status: He is alert and oriented to person, place, and time.  Psychiatric:        Behavior: Behavior normal.     POC TESTING Office Visit on 01/27/2018  Component Date Value Ref Range Status  . Glucose 01/27/2018 85  65 - 99 mg/dL Final  . BUN 16/11/9602 9  6 - 20 mg/dL Final  . Creatinine, Ser 01/27/2018 0.81  0.57 - 1.00 mg/dL Final  . GFR calc non Af Amer 01/27/2018 99  >59 mL/min/1.73 Final  . GFR calc Af Amer 01/27/2018 114  >59 mL/min/1.73 Final  . BUN/Creatinine Ratio 01/27/2018 11  9 - 23 Final  . Sodium 01/27/2018 142  134 - 144 mmol/L Final  . Potassium 01/27/2018 4.5  3.5 - 5.2 mmol/L Final    . Chloride 01/27/2018 102  96 - 106 mmol/L Final  . CO2  01/27/2018 24  20 - 29 mmol/L Final  . Calcium 01/27/2018 9.6  8.7 - 10.2 mg/dL Final  . Total Protein 01/27/2018 7.2  6.0 - 8.5 g/dL Final  . Albumin 14/78/295612/16/2019 4.9  3.5 - 5.5 g/dL Final  . Globulin, Total 01/27/2018 2.3  1.5 - 4.5 g/dL Final  . Albumin/Globulin Ratio 01/27/2018 2.1  1.2 - 2.2 Final  . Bilirubin Total 01/27/2018 2.1* 0.0 - 1.2 mg/dL Final  . Alkaline Phosphatase 01/27/2018 84  39 - 117 IU/L Final  . AST 01/27/2018 15  0 - 40 IU/L Final  . ALT 01/27/2018 11  0 - 32 IU/L Final  . WBC 01/27/2018 7.2  3.4 - 10.8 x10E3/uL Final  . RBC 01/27/2018 5.53* 3.77 - 5.28 x10E6/uL Final  . Hemoglobin 01/27/2018 16.3* 11.1 - 15.9 g/dL Final  . Hematocrit 21/30/865712/16/2019 48.5* 34.0 - 46.6 % Final  . MCV 01/27/2018 88  79 - 97 fL Final  . MCH 01/27/2018 29.5  26.6 - 33.0 pg Final  . MCHC 01/27/2018 33.6  31.5 - 35.7 g/dL Final  . RDW 84/69/629512/16/2019 12.7  12.3 - 15.4 % Final   Comment: **Effective February 17, 2018, the RDW pediatric reference**   interval will be removed and the adult reference interval   will be changing to:                             Female 11.7 - 15.4                                                      Female 11.6 - 15.4   . Platelets 01/27/2018 237  150 - 450 x10E3/uL Final  . Neutrophils 01/27/2018 64  Not Estab. % Final  . Lymphs 01/27/2018 30  Not Estab. % Final  . Monocytes 01/27/2018 5  Not Estab. % Final  . Eos 01/27/2018 1  Not Estab. % Final  . Basos 01/27/2018 0  Not Estab. % Final  . Neutrophils Absolute 01/27/2018 4.6  1.4 - 7.0 x10E3/uL Final  . Lymphocytes Absolute 01/27/2018 2.2  0.7 - 3.1 x10E3/uL Final  . Monocytes Absolute 01/27/2018 0.4  0.1 - 0.9 x10E3/uL Final  . EOS (ABSOLUTE) 01/27/2018 0.1  0.0 - 0.4 x10E3/uL Final  . Basophils Absolute 01/27/2018 0.0  0.0 - 0.2 x10E3/uL Final  . Immature Granulocytes 01/27/2018 0  Not Estab. % Final  . Immature Grans (Abs) 01/27/2018 0.0  0.0 - 0.1  x10E3/uL Final  . Testosterone, Total, LC/MS 01/27/2018 329.1* 10.0 - 55.0 ng/dL Final  . Testosterone, Free 01/27/2018 7.7* 0.0 - 4.2 pg/mL Final  . Sex Hormone Binding 01/27/2018 16.3* 24.6 - 122.0 nmol/L Final  . Estradiol 01/27/2018 <5.0  pg/mL Final   Comment:                     Adult Female:                       Follicular phase   12.5 -   166.0                       Ovulation phase    85.8 -   498.0  Luteal phase       43.8 -   211.0                       Postmenopausal     <6.0 -    54.7                     Pregnancy                       1st trimester     215.0 - >4300.0                     Girls (1-10 years)    6.0 -    27.0 Roche ECLIA methodology      Assessment & Plan:   1. Encounter for long-term current use of high risk medication   2. Long term (current) use of systemic steroids   3. Female-to-female transgender person     Patient will continue on current chronic medications other than changes noted above, so ok to refill when needed.   See after visit summary for patient specific instructions.  Orders Placed This Encounter  Procedures  . Comprehensive metabolic panel  . CBC with Differential/Platelet  . TestT+TestF+SHBG  . Estradiol    Meds ordered this encounter  Medications  . testosterone cypionate (DEPOTESTOSTERONE CYPIONATE) 200 MG/ML injection    Sig: ADMINISTER 0.4 ML(80 MG) IN THE MUSCLE 1 TIME A WEEK    Dispense:  6 mL    Refill:  1    Patient verbalized to me that they understand the following: diagnosis, what is being done for them, what to expect and what should be done at home.  Their questions have been answered. They understand that I am unable to predict every possible medication interaction or adverse outcome and that if any unexpected symptoms arise, they should contact us and their pharmacist, as well as never hesitate to seek urgent/emergent care at [facility] or ER if they think it might be warranted.    Norberto Sorenson, MD, MPH Primary Care at [facility] [address] [place], [place]  [phone] Office phone  [phone] Office fax  01/27/18 12:38 PM

## 2018-01-27 NOTE — Patient Instructions (Addendum)
  The following plastic surgeons are very well-known for doing excellent work and tailoring much of their practice specifically to both female and female transgender individuals:  Dr. Learta CoddingHope Sherie [facility] [address] [place], [place], [id number] [email or web address]  Dr. Minette BrineKeelee MacPhee [facility] (though surgeries are usually performed at [facility]) [address], [address], [place], [place], [id number] [email or web address]  Dr. Lodema PilotEric Emmerson [facility] [address] [place], [place] [address] Phone: [phone] [email or web address]   Locally, I have had transgender patients report excellent experiences with the below surgeons who perform both breast implants and mastectomy with female chest wall reconstruction:  Dr. Louisa SecondGerald Truesdale [facility] [address], [place], [place] [address]   Phone: [phone] [email or web address]  Dr. Westley FootsAndrew Schneider [facility] (affiliate of [facility]) Locations in [place], [place], and [place] Phone: [phone] [email or web address]      If you have lab work done today you will be contacted with your lab results within the next 2 weeks.  If you have not heard from us then please contact us. The fastest way to get your results is to register for My Chart.   IF you received an x-ray today, you will receive an invoice from [facility]. Please contact [facility] at [phone] with questions or concerns regarding your invoice.   IF you received labwork today, you will receive an invoice from [facility]. Please contact [facility] at [phone] with questions or concerns regarding your invoice.   Our billing staff will not be able to assist you with questions  regarding bills from these companies.  You will be contacted with the lab results as soon as they are available. The fastest way to get your results is to activate your My Chart account. Instructions are located on the last page of this paperwork. If you have not heard from us regarding the results in 2 weeks, please contact this office.

## 2018-02-01 LAB — CBC WITH DIFFERENTIAL/PLATELET
BASOS ABS: 0 10*3/uL (ref 0.0–0.2)
Basos: 0 %
EOS (ABSOLUTE): 0.1 10*3/uL (ref 0.0–0.4)
EOS: 1 %
HEMOGLOBIN: 16.3 g/dL — AB (ref 11.1–15.9)
Hematocrit: 48.5 % — ABNORMAL HIGH (ref 34.0–46.6)
IMMATURE GRANULOCYTES: 0 %
Immature Grans (Abs): 0 10*3/uL (ref 0.0–0.1)
LYMPHS: 30 %
Lymphocytes Absolute: 2.2 10*3/uL (ref 0.7–3.1)
MCH: 29.5 pg (ref 26.6–33.0)
MCHC: 33.6 g/dL (ref 31.5–35.7)
MCV: 88 fL (ref 79–97)
MONOCYTES: 5 %
Monocytes Absolute: 0.4 10*3/uL (ref 0.1–0.9)
Neutrophils Absolute: 4.6 10*3/uL (ref 1.4–7.0)
Neutrophils: 64 %
Platelets: 237 10*3/uL (ref 150–450)
RBC: 5.53 x10E6/uL — AB (ref 3.77–5.28)
RDW: 12.7 % (ref 12.3–15.4)
WBC: 7.2 10*3/uL (ref 3.4–10.8)

## 2018-02-01 LAB — COMPREHENSIVE METABOLIC PANEL
ALBUMIN: 4.9 g/dL (ref 3.5–5.5)
ALK PHOS: 84 IU/L (ref 39–117)
ALT: 11 IU/L (ref 0–32)
AST: 15 IU/L (ref 0–40)
Albumin/Globulin Ratio: 2.1 (ref 1.2–2.2)
BILIRUBIN TOTAL: 2.1 mg/dL — AB (ref 0.0–1.2)
BUN/Creatinine Ratio: 11 (ref 9–23)
BUN: 9 mg/dL (ref 6–20)
CHLORIDE: 102 mmol/L (ref 96–106)
CO2: 24 mmol/L (ref 20–29)
CREATININE: 0.81 mg/dL (ref 0.57–1.00)
Calcium: 9.6 mg/dL (ref 8.7–10.2)
GFR calc non Af Amer: 99 mL/min/{1.73_m2} (ref >59)
GFR, EST AFRICAN AMERICAN: 114 mL/min/{1.73_m2} (ref >59)
GLUCOSE: 85 mg/dL (ref 65–99)
Globulin, Total: 2.3 g/dL (ref 1.5–4.5)
Potassium: 4.5 mmol/L (ref 3.5–5.2)
Sodium: 142 mmol/L (ref 134–144)
TOTAL PROTEIN: 7.2 g/dL (ref 6.0–8.5)

## 2018-02-01 LAB — TESTT+TESTF+SHBG
SEX HORMONE BINDING: 16.3 nmol/L — AB (ref 24.6–122.0)
TESTOSTERONE FREE: 7.7 pg/mL — AB (ref 0.0–4.2)
TESTOSTERONE, TOTAL, LC/MS: 329.1 ng/dL — AB (ref 10.0–55.0)

## 2018-02-01 LAB — ESTRADIOL

## 2018-04-03 ENCOUNTER — Telehealth: Payer: Self-pay | Admitting: Family Medicine

## 2018-04-03 NOTE — Telephone Encounter (Signed)
Patient is needing a prescription for needles sent to the pharmacy. [facility] on [address].

## 2018-04-04 ENCOUNTER — Encounter: Payer: Self-pay | Admitting: Physical Therapy

## 2018-04-15 ENCOUNTER — Telehealth: Payer: Self-pay | Admitting: Family Medicine

## 2018-04-15 NOTE — Telephone Encounter (Signed)
mychart message sent to pt about their appointment with Dr Shaw °

## 2018-05-14 ENCOUNTER — Institutional Professional Consult (permissible substitution): Payer: Self-pay | Admitting: Emergency Medicine

## 2018-05-16 ENCOUNTER — Ambulatory Visit: Payer: 59 | Admitting: Family Medicine

## 2018-07-29 ENCOUNTER — Ambulatory Visit: Payer: 59 | Admitting: Family Medicine

## 2018-07-30 ENCOUNTER — Other Ambulatory Visit: Payer: Self-pay | Admitting: Family Medicine

## 2018-07-30 ENCOUNTER — Other Ambulatory Visit (HOSPITAL_COMMUNITY)
Admission: RE | Admit: 2018-07-30 | Discharge: 2018-07-30 | Disposition: A | Discharge status: Home / Self Care | Payer: 59 | Source: Ambulatory Visit | Attending: Family Medicine | Admitting: Family Medicine

## 2018-07-30 DIAGNOSIS — Z01411 Encounter for gynecological examination (general) (routine) with abnormal findings: Secondary | ICD-10-CM | POA: Insufficient documentation

## 2018-07-31 LAB — CYTOLOGY - PAP
Diagnosis: NEGATIVE
HPV: NOT DETECTED

## 2018-08-11 ENCOUNTER — Ambulatory Visit: Payer: 59 | Admitting: Emergency Medicine

## 2018-08-11 ENCOUNTER — Encounter: Payer: Self-pay | Admitting: Emergency Medicine

## 2018-08-11 ENCOUNTER — Ambulatory Visit (INDEPENDENT_AMBULATORY_CARE_PROVIDER_SITE_OTHER): Payer: 59

## 2018-08-11 ENCOUNTER — Other Ambulatory Visit: Payer: Self-pay

## 2018-08-11 VITALS — BP 112/68 | HR 92 | Ht 68.25 in | Wt 143.0 lb

## 2018-08-11 DIAGNOSIS — R0602 Shortness of breath: Secondary | ICD-10-CM

## 2018-08-11 DIAGNOSIS — R05 Cough: Secondary | ICD-10-CM | POA: Diagnosis not present

## 2018-08-11 DIAGNOSIS — R059 Cough, unspecified: Secondary | ICD-10-CM

## 2018-08-11 DIAGNOSIS — R0600 Dyspnea, unspecified: Secondary | ICD-10-CM | POA: Insufficient documentation

## 2018-08-11 DIAGNOSIS — R053 Chronic cough: Secondary | ICD-10-CM | POA: Insufficient documentation

## 2018-08-11 NOTE — Assessment & Plan Note (Signed)
Shortness of breath with exertion and also some specific exposures.  The wheeze, dyspnea, cough are all suggestive of asthma.  The time of onset around age 28 would be consistent.  We will arrange for chest x-ray, pulmonary function testing to assess for obstruction.  Continue his albuterol, Singulair for now.  May decide to reinitiate scheduled bronchodilators depending on results.

## 2018-08-11 NOTE — Assessment & Plan Note (Signed)
Probable contributions of his allergic rhinitis and also GERD.  Moderate control on omeprazole, may decide to increase, change going forward.  He is on monotherapy with Singulair, may benefit from adding antihistamine, consider nasal steroid or even allergy referral depending on symptoms.

## 2018-08-11 NOTE — Progress Notes (Signed)
Subjective:    Patient ID: Rebecca Clay, adult    DOB: 01/02/1991, 28 y.o.   MRN: [id number]  HPI 28 year old man, never smoker, history of allergic rhinitis, anxiety/depression, GERD, ADD, masculinizing gender affirming hormone therapy (followed at [place]), referred today for dyspnea and cough.    He notes some wheeze with exertion going back to 2013-14. He has been started empirically on BD's with some improvement - started Symbicort this year, since stopped it, no real change. Does feel some benefit from SABA. Occasional gets chest discomfort. Tough to exercise - has to stop when biking. SOB and also wheeze at those times.   More recently has developed cough - in the last year, seems to bother him with cold air, smoke. Does get some occasional GERD on omeprazole. Probably gets cough at least once daily.    Review of Systems  Constitutional: Negative for fever and unexpected weight change.  HENT: Negative for congestion, dental problem, ear pain, nosebleeds, postnasal drip, rhinorrhea, sinus pressure, sneezing, sore throat and trouble swallowing.   Eyes: Negative for redness and itching.  Respiratory: Positive for cough, chest tightness, shortness of breath and wheezing.   Cardiovascular: Negative for palpitations and leg swelling.  Gastrointestinal: Negative for nausea and vomiting.  Genitourinary: Negative for dysuria.  Musculoskeletal: Negative for joint swelling.  Skin: Negative for rash.  Neurological: Negative for headaches.  Hematological: Does not bruise/bleed easily.  Psychiatric/Behavioral: Negative for dysphoric mood. The patient is not nervous/anxious.     Past Medical History:  Diagnosis Date  . Allergy   . Anxiety   . Asthma   . Depression   . Mental disorder      Family History  Problem Relation Age of Onset  . Healthy Mother   . Hyperlipidemia Father   . Hypertension Father   . Healthy Sister   . Hyperlipidemia Maternal Grandmother   . Cancer Maternal  Grandfather   . Hyperlipidemia Maternal Grandfather   . Diabetes Paternal Grandfather   . Hyperlipidemia Paternal Grandfather      Social History   Socioeconomic History  . Marital status: Married    Spouse name: Not on file  . Number of children: Not on file  . Years of education: Not on file  . Highest education level: Not on file  Occupational History  . Not on file  Social Needs  . Financial resource strain: Not on file  . Food insecurity    Worry: Not on file    Inability: Not on file  . Transportation needs    Medical: Not on file    Non-medical: Not on file  Tobacco Use  . Smoking status: Never Smoker  . Smokeless tobacco: Never Used  Substance and Sexual Activity  . Alcohol use: No    Frequency: Never  . Drug use: Never  . Sexual activity: Yes  Lifestyle  . Physical activity    Days per week: Not on file    Minutes per session: Not on file  . Stress: Not on file  Relationships  . Social Herbalist on phone: Not on file    Gets together: Not on file    Attends religious service: Not on file    Active member of club or organization: Not on file    Attends meetings of clubs or organizations: Not on file    Relationship status: Not on file  . Intimate partner violence    Fear of current or ex partner: Not on file  Emotionally abused: Not on file    Physically abused: Not on file    Forced sexual activity: Not on file  Other Topics Concern  . Not on file  Social History Narrative   ** Merged History Encounter **      Trans F > M Works at [employer], significant animal dander exposure.  Some chemical exposure.  Has lived in [place].  No travel or military   Allergies  Allergen Reactions  . Bee Pollen Cough  . Peanut-Containing Drug Products     Pt avoids all nuts  . Pollen Extract Cough     Outpatient Medications Prior to Visit  Medication Sig Dispense Refill  . albuterol (PROVENTIL HFA;VENTOLIN HFA) 108 (90 BASE) MCG/ACT inhaler Inhale 1 puff  into the lungs every 6 (six) hours as needed for wheezing or shortness of breath.    Marland Kitchen. ibuprofen (ADVIL,MOTRIN) 800 MG tablet Take 1 tablet (800 mg total) by mouth 3 (three) times daily. 21 tablet 0  . methylphenidate 54 MG PO CR tablet Take 54 mg by mouth every morning.    Marland Kitchen. omeprazole (PRILOSEC) 40 MG capsule Take 40 mg by mouth daily.    Marland Kitchen. testosterone cypionate (DEPOTESTOSTERONE CYPIONATE) 200 MG/ML injection ADMINISTER 0.4 ML(80 MG) IN THE MUSCLE 1 TIME A WEEK 6 mL 1  . escitalopram (LEXAPRO) 20 MG tablet Take 1 tablet (20 mg total) by mouth daily. 30 tablet 0  . albuterol (PROVENTIL HFA;VENTOLIN HFA) 108 (90 Base) MCG/ACT inhaler Inhale into the lungs every 6 (six) hours as needed for wheezing or shortness of breath.    . cyclobenzaprine (FLEXERIL) 10 MG tablet Take 1 tablet (10 mg total) by mouth 2 (two) times daily as needed for muscle spasms. 10 tablet 0  . drospirenone-ethinyl estradiol (YAZ,GIANVI,LORYNA) 3-0.02 MG tablet Take 1 tablet by mouth daily.    Marland Kitchen. loratadine (CLARITIN) 5 MG chewable tablet Chew 5 mg by mouth daily.    . naproxen (NAPROSYN) 500 MG tablet Take 1 tablet (500 mg total) by mouth 2 (two) times daily. 30 tablet 0  . naproxen (NAPROSYN) 500 MG tablet Take 500 mg by mouth 2 (two) times daily with a meal.    . traMADol (ULTRAM) 50 MG tablet Take 1 tablet (50 mg total) by mouth every 6 (six) hours as needed. 11 tablet 0  . traZODone (DESYREL) 50 MG tablet Take 1 tablet (50 mg total) by mouth at bedtime as needed for sleep. (Patient not taking: Reported on 04/04/2014) 30 tablet 0   No facility-administered medications prior to visit.         Objective:   Physical Exam Vitals:   08/11/18 1427  BP: 112/68  Pulse: 92  SpO2: 99%  Weight: 143 lb (64.9 kg)  Height: 5' 8.25" (1.734 m)   Gen: Pleasant, thin, well-nourished, in no distress,  normal affect  ENT: No lesions,  mouth clear,  oropharynx clear, no postnasal drip  Neck: No JVD, no stridor  Lungs: No use of  accessory muscles, no crackles or wheezing on normal respiration, no wheeze on forced expiration  Cardiovascular: RRR, heart sounds normal, no murmur or gallops, no peripheral edema  Musculoskeletal: No deformities, no cyanosis or clubbing  Neuro: alert, awake, non focal  Skin: Warm, no lesions or rash      Assessment & Plan:  Dyspnea Shortness of breath with exertion and also some specific exposures.  The wheeze, dyspnea, cough are all suggestive of asthma.  The time of onset around age 28 would be  consistent.  We will arrange for chest x-ray, pulmonary function testing to assess for obstruction.  Continue his albuterol, Singulair for now.  May decide to reinitiate scheduled bronchodilators depending on results.  Chronic cough Probable contributions of his allergic rhinitis and also GERD.  Moderate control on omeprazole, may decide to increase, change going forward.  He is on monotherapy with Singulair, may benefit from adding antihistamine, consider nasal steroid or even allergy referral depending on symptoms.  Levy Pupaobert , MD, PhD 08/11/2018, 3:01 PM [facility] [phone] or if no answer [phone]

## 2018-08-11 NOTE — Patient Instructions (Signed)
Chest x-ray today We will perform pulmonary function testing to evaluate for both asthma and upper airway irritation We will not restart scheduled inhaled medicine right now, may decide to do so in the future Please keep your albuterol available use 2 puffs if needed for shortness of breath, chest tightness, wheezing. Continue montelukast as you have been taking it Continue omeprazole as you have been taking it Follow with Dr. Lamonte Sakai next available with full pulmonary function testing on the same day.

## 2018-08-13 ENCOUNTER — Institutional Professional Consult (permissible substitution): Payer: Self-pay | Admitting: Emergency Medicine

## 2018-12-16 ENCOUNTER — Telehealth: Payer: Self-pay | Admitting: Emergency Medicine

## 2018-12-16 NOTE — Telephone Encounter (Signed)
Rebecca Clay is checking on this one

## 2019-04-16 ENCOUNTER — Telehealth: Payer: Self-pay | Admitting: Emergency Medicine

## 2019-04-16 NOTE — Telephone Encounter (Signed)
  Spoke with pt, advised him that he was supposed to have a PFT and follow up with RB. I scheduled a f/u appt with TP and scheduled PFT with Covid test as well. Pt understood and nothing further is needed.    Author: Leslye Peer, MD Author Type: Physician Filed: 08/11/2018 2:58 PM  Note Status: Signed Cosign: Cosign Not Required Encounter Date: 08/11/2018  Editor: Leslye Peer, MD (Physician)    Chest x-ray today We will perform pulmonary function testing to evaluate for both asthma and upper airway irritation We will not restart scheduled inhaled medicine right now, may decide to do so in the future Please keep your albuterol available use 2 puffs if needed for shortness of breath, chest tightness, wheezing. Continue montelukast as you have been taking it Continue omeprazole as you have been taking it Follow with Dr. Delton Coombes next available with full pulmonary function testing on the same day.

## 2019-06-30 ENCOUNTER — Other Ambulatory Visit (HOSPITAL_COMMUNITY)
Admission: RE | Admit: 2019-06-30 | Discharge: 2019-06-30 | Disposition: A | Discharge status: Home / Self Care | Payer: 59 | Source: Ambulatory Visit | Attending: Adult Health | Admitting: Adult Health

## 2019-06-30 DIAGNOSIS — Z01812 Encounter for preprocedural laboratory examination: Secondary | ICD-10-CM | POA: Diagnosis present

## 2019-06-30 DIAGNOSIS — Z20822 Contact with and (suspected) exposure to covid-19: Secondary | ICD-10-CM | POA: Diagnosis not present

## 2019-06-30 LAB — SARS CORONAVIRUS 2 (TAT 6-24 HRS): SARS Coronavirus 2: NEGATIVE

## 2019-07-03 ENCOUNTER — Encounter: Payer: Self-pay | Admitting: Adult Health

## 2019-07-03 ENCOUNTER — Ambulatory Visit (INDEPENDENT_AMBULATORY_CARE_PROVIDER_SITE_OTHER): Payer: 59 | Admitting: Emergency Medicine

## 2019-07-03 ENCOUNTER — Other Ambulatory Visit: Payer: Self-pay

## 2019-07-03 ENCOUNTER — Ambulatory Visit: Payer: 59 | Admitting: Adult Health

## 2019-07-03 DIAGNOSIS — K219 Gastro-esophageal reflux disease without esophagitis: Secondary | ICD-10-CM | POA: Diagnosis not present

## 2019-07-03 DIAGNOSIS — J4520 Mild intermittent asthma, uncomplicated: Secondary | ICD-10-CM | POA: Diagnosis not present

## 2019-07-03 DIAGNOSIS — R05 Cough: Secondary | ICD-10-CM | POA: Diagnosis not present

## 2019-07-03 DIAGNOSIS — J309 Allergic rhinitis, unspecified: Secondary | ICD-10-CM | POA: Insufficient documentation

## 2019-07-03 DIAGNOSIS — R059 Cough, unspecified: Secondary | ICD-10-CM

## 2019-07-03 DIAGNOSIS — J45909 Unspecified asthma, uncomplicated: Secondary | ICD-10-CM | POA: Insufficient documentation

## 2019-07-03 LAB — PULMONARY FUNCTION TEST
DL/VA % pred: 121 %
DL/VA: 5.39 ml/min/mmHg/L
DLCO unc % pred: 123 %
DLCO unc: 32.49 ml/min/mmHg
FEF 25-75 Post: 5.24 L/sec
FEF 25-75 Pre: 5.82 L/sec
FEF2575-%Change-Post: -9 %
FEF2575-%Pred-Post: 138 %
FEF2575-%Pred-Pre: 153 %
FEV1-%Change-Post: -10 %
FEV1-%Pred-Post: 107 %
FEV1-%Pred-Pre: 120 %
FEV1-Post: 3.97 L
FEV1-Pre: 4.46 L
FEV1FVC-%Change-Post: -4 %
FEV1FVC-%Pred-Pre: 114 %
FEV6-%Change-Post: -7 %
FEV6-%Pred-Post: 98 %
FEV6-%Pred-Pre: 105 %
FEV6-Post: 4.29 L
FEV6-Pre: 4.62 L
FEV6FVC-%Pred-Post: 101 %
FEV6FVC-%Pred-Pre: 101 %
FVC-%Change-Post: -7 %
FVC-%Pred-Post: 97 %
FVC-%Pred-Pre: 104 %
FVC-Post: 4.29 L
FVC-Pre: 4.62 L
Post FEV1/FVC ratio: 93 %
Post FEV6/FVC ratio: 100 %
Pre FEV1/FVC ratio: 97 %
Pre FEV6/FVC Ratio: 100 %
RV % pred: 159 %
RV: 2.57 L
TLC % pred: 113 %
TLC: 6.57 L

## 2019-07-03 NOTE — Patient Instructions (Signed)
Ventolin 2 puffs every 4hrs as needed wheezing, cough, shortness of breath  Claritin 10mg  At bedtime  As needed  Drainage  Continue on Singulair 10mg  At bedtime  .  Activity as tolerated.  Continue on Prilosec daily  Follow up with Dr. in 6 months and As needed

## 2019-07-03 NOTE — Assessment & Plan Note (Signed)
Continue on current regimen and trigger prevention ?

## 2019-07-03 NOTE — Progress Notes (Signed)
Full PFT performed today. °

## 2019-07-03 NOTE — Assessment & Plan Note (Signed)
Continue on GERD diet and Prilosec

## 2019-07-03 NOTE — Assessment & Plan Note (Signed)
PFT showed normal lung function.  May have a component of mild intermittent asthma.  Previously no perceived benefit with Symbicort.  May use albuterol as needed.  Continue to control for triggers such as allergic rhinitis and GERD.  Plan  Patient Instructions  Ventolin 2 puffs every 4hrs as needed wheezing, cough, shortness of breath  Claritin 10mg  At bedtime  As needed  Drainage  Continue on Singulair 10mg  At bedtime  .  Activity as tolerated.  Continue on Prilosec daily  Follow up with Dr. in 6 months and As needed

## 2019-07-03 NOTE — Progress Notes (Signed)
@Patient  ID: , adult    DOB: September 26, 1990, 29 y.o.   MRN: [id number]  Chief Complaint  Patient presents with  . Follow-up    Dyspnea     Referring provider: [id number], MD  HPI: 29 year old female never smoker seen for pulmonary consult August 11, 2018 for cough and shortness of breath Medical history significant for anxiety, depression, allergic rhinitis, GERD, ADD, masculinizing gender affirming hormone therapy (followed at [facility])  TEST/EVENTS :  PFT Jul 03, 2019 showed FEV1 120%, ratio 97, FVC 104%, DLCO 123%, no significant bronchodilator response  07/03/2019 Follow up : Cough and dyspnea  Patient returns for a follow-up.  Patient was seen last visit August 11, 2018 for pulmonary consult for cough and shortness of breath.  X-ray at that time showed clear lungs.  Patient is a never smoker.  Had been tried on Symbicort in the past with no perceived benefit.  He was recommended continue on Singulair daily.  Along with omeprazole for possible GERD component. Patient says took him a while to get PFTs set up . PFT done today show normal lung function Seems to have increased cough with cold air and smells such a smoke .  Since last visit patient says he is doing about the same symptoms seem to come and go.  Says he seems to know when his triggers are going to be such as above with cold air and strong smells.  Says he is active.  No postnasal drainage.  Feels that reflux is controlled on Prilosec daily   Allergies  Allergen Reactions  . Bee Pollen Cough  . Peanut-Containing Drug Products     Pt avoids all nuts  . Pollen Extract Cough    Immunization History  Administered Date(s) Administered  . Janssen (J&J) SARS-COV-2 Vaccination 04/26/2019  . Tdap 06/16/2015    Past Medical History:  Diagnosis Date  . Allergy   . Anxiety   . Asthma   . Depression   . Mental disorder     Tobacco History: Social History   Tobacco Use  Smoking Status Never Smoker  Smokeless  Tobacco Never Used   Counseling given: Not Answered   Outpatient Medications Prior to Visit  Medication Sig Dispense Refill  . albuterol (PROVENTIL HFA;VENTOLIN HFA) 108 (90 BASE) MCG/ACT inhaler Inhale 1 puff into the lungs every 6 (six) hours as needed for wheezing or shortness of breath.    08/16/2015 ibuprofen (ADVIL,MOTRIN) 800 MG tablet Take 1 tablet (800 mg total) by mouth 3 (three) times daily. 21 tablet 0  . methylphenidate 54 MG PO CR tablet Take 54 mg by mouth every morning.    Marland Kitchen omeprazole (PRILOSEC) 40 MG capsule Take 40 mg by mouth daily.    Marland Kitchen testosterone cypionate (DEPOTESTOSTERONE CYPIONATE) 200 MG/ML injection ADMINISTER 0.4 ML(80 MG) IN THE MUSCLE 1 TIME A WEEK 6 mL 1   No facility-administered medications prior to visit.     Review of Systems:   Constitutional:   No  weight loss, night sweats,  Fevers, chills, fatigue, or  lassitude.  HEENT:   No headaches,  Difficulty swallowing,  Tooth/dental problems, or  Sore throat,                No sneezing, itching, ear ache, nasal congestion, post nasal drip,   CV:  No chest pain,  Orthopnea, PND, swelling in lower extremities, anasarca, dizziness, palpitations, syncope.   GI  No heartburn, indigestion, abdominal pain, nausea, vomiting, diarrhea, change in bowel habits, loss  of appetite, bloody stools.   Resp:   No chest wall deformity  Skin: no rash or lesions.  GU: no dysuria, change in color of urine, no urgency or frequency.  No flank pain, no hematuria   MS:  No joint pain or swelling.  No decreased range of motion.  No back pain.    Physical Exam  BP 116/72 (BP Location: Left Arm, Cuff Size: Normal)   Pulse 74   Ht 5\' 9"  (1.753 m)   Wt 144 lb (65.3 kg)   SpO2 99%   BMI 21.27 kg/m   GEN: A/Ox3; pleasant , NAD, well nourished    HEENT:  [place]/AT,  EACs-clear, TMs-wnl, NOSE-clear, THROAT-clear, no lesions, no postnasal drip or exudate noted.   NECK:  Supple w/ fair ROM; no JVD; normal carotid impulses w/o  bruits; no thyromegaly or nodules palpated; no lymphadenopathy.    RESP  Clear  P & A; w/o, wheezes/ rales/ or rhonchi. no accessory muscle use, no dullness to percussion  CARD:  RRR, no m/r/g, no peripheral edema, pulses intact, no cyanosis or clubbing.  GI:   Soft & nt; nml bowel sounds; no organomegaly or masses detected.   Musco: Warm bil, no deformities or joint swelling noted.   Neuro: alert, no focal deficits noted.    Skin: Warm, no lesions or rashes    Lab Results:  CBC  BMET   BNP No results found for: BNP  ProBNP No results found for: PROBNP  Imaging: No results found.    PFT Results Latest Ref Rng & Units 07/03/2019  FVC-Pre L 4.62  FVC-Predicted Pre % 104  FVC-Post L 4.29  FVC-Predicted Post % 97  Pre FEV1/FVC % % 97  Post FEV1/FCV % % 93  FEV1-Pre L 4.46  FEV1-Predicted Pre % 120  FEV1-Post L 3.97  DLCO UNC% % 123  DLCO COR %Predicted % 121  TLC L 6.57  TLC % Predicted % 113  RV % Predicted % 159    No results found for: NITRICOXIDE      Assessment & Plan:   Asthma PFT showed normal lung function.  May have a component of mild intermittent asthma.  Previously no perceived benefit with Symbicort.  May use albuterol as needed.  Continue to control for triggers such as allergic rhinitis and GERD.  Plan  Patient Instructions  Ventolin 2 puffs every 4hrs as needed wheezing, cough, shortness of breath  Claritin 10mg  At bedtime  As needed  Drainage  Continue on Singulair 10mg  At bedtime  .  Activity as tolerated.  Continue on Prilosec daily  Follow up with Dr. Lamonte Sakai in 6 months and As needed        Allergic rhinitis Continue on current regimen and trigger prevention  GERD (gastroesophageal reflux disease) Continue on GERD diet and Prilosec     Rexene Edison, NP 07/03/2019

## 2019-08-05 ENCOUNTER — Other Ambulatory Visit: Payer: Self-pay | Admitting: Family Medicine

## 2019-08-05 DIAGNOSIS — R590 Localized enlarged lymph nodes: Secondary | ICD-10-CM

## 2019-08-18 ENCOUNTER — Ambulatory Visit
Admission: RE | Admit: 2019-08-18 | Discharge: 2019-08-18 | Disposition: A | Discharge status: Home / Self Care | Payer: 59 | Source: Ambulatory Visit | Attending: Family Medicine | Admitting: Family Medicine

## 2019-08-18 DIAGNOSIS — R590 Localized enlarged lymph nodes: Secondary | ICD-10-CM

## 2019-12-16 ENCOUNTER — Ambulatory Visit: Payer: 59 | Admitting: Emergency Medicine

## 2019-12-30 ENCOUNTER — Ambulatory Visit: Payer: 59 | Admitting: Emergency Medicine

## 2019-12-30 ENCOUNTER — Other Ambulatory Visit: Payer: Self-pay

## 2019-12-30 ENCOUNTER — Encounter: Payer: Self-pay | Admitting: Emergency Medicine

## 2019-12-30 DIAGNOSIS — J4520 Mild intermittent asthma, uncomplicated: Secondary | ICD-10-CM | POA: Diagnosis not present

## 2019-12-30 MED ORDER — ALBUTEROL SULFATE HFA 108 (90 BASE) MCG/ACT IN AERS: 1.0000 | INHALATION_SPRAY | Freq: Four times a day (QID) | RESPIRATORY_TRACT | 5 refills | Status: DC | PRN

## 2019-12-30 NOTE — Assessment & Plan Note (Signed)
Spirometry reassuring.  Possible mild asthma, especially with irritating exposures including smoke, cleaning chemicals from work.  There may be a coexisting upper airway irritation that is contributing or possibly even causing all of the cough.  We will try pretreating with albuterol before the chemical exposure to see if this helps the cough, dyspnea.  Depending on results, frequency of use we may decide to try scheduled bronchodilator therapy or ICS therapy.  We will follow-up in 3 months to see whether the albuterol has made any impact.

## 2019-12-30 NOTE — Addendum Note (Signed)
Addended by: Dorisann Frames R on: 12/30/2019 01:39 PM   Modules accepted: Orders

## 2019-12-30 NOTE — Progress Notes (Signed)
Subjective:    Patient ID: Rebecca Clay, adult    DOB: 08/25/1990, 29 y.o.   MRN: [id number]  HPI 29 year old man, never smoker, history of allergic rhinitis, anxiety/depression, GERD, ADD, masculinizing gender affirming hormone therapy (followed at [place]), referred today for dyspnea and cough.    He notes some wheeze with exertion going back to 2013-14. He has been started empirically on BD's with some improvement - started Symbicort this year, since stopped it, no real change. Does feel some benefit from SABA. Occasional gets chest discomfort. Tough to exercise - has to stop when biking. SOB and also wheeze at those times.   More recently has developed cough - in the last year, seems to bother him with cold air, smoke. Does get some occasional GERD on omeprazole. Probably gets cough at least once daily.   ROV 12/30/19 --29-year-old gentleman, never smoker with allergic rhinitis, anxiety/depression, GERD, ADD, masculinizing gender affirming hormone therapy.  Seen for exertional dyspnea seems to be trigger dependent including cold air, exercise, smoke exposure.  Underwent pulmonary function testing 07/03/2019 that does not show any overt obstruction.  Today reports that he has been doing fairly well -- still has trouble with smoke exposure, causes cough. 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 Can be runs of coughing.    Review of Systems  Constitutional: Negative for fever and unexpected weight change.  HENT: Negative for congestion, dental problem, ear pain, nosebleeds, postnasal drip, rhinorrhea, sinus pressure, sneezing, sore throat and trouble swallowing.   Eyes: Negative for redness and itching.  Respiratory: Positive for cough, chest tightness, shortness of breath and wheezing.   Cardiovascular: Negative for palpitations and leg swelling.  Gastrointestinal: Negative for nausea and vomiting.  Genitourinary: Negative for dysuria.  Musculoskeletal: Negative for joint  swelling.  Skin: Negative for rash.  Neurological: Negative for headaches.  Hematological: Does not bruise/bleed easily.  Psychiatric/Behavioral: Negative for dysphoric mood. The patient is not nervous/anxious.     Past Medical History:  Diagnosis Date  . Allergy   . Anxiety   . Asthma   . Depression   . Mental disorder      Family History  Problem Relation Age of Onset  . Healthy Mother   . Hyperlipidemia Father   . Hypertension Father   . Healthy Sister   . Hyperlipidemia Maternal Grandmother   . Cancer Maternal Grandfather   . Hyperlipidemia Maternal Grandfather   . Diabetes Paternal Grandfather   . Hyperlipidemia Paternal Grandfather      Social History   Socioeconomic History  . Marital status: Married    Spouse name: Not on file  . Number of children: Not on file  . Years of education: Not on file  . Highest education level: Not on file  Occupational History  . Not on file  Tobacco Use  . Smoking status: Never Smoker  . Smokeless tobacco: Never Used  Substance and Sexual Activity  . Alcohol use: No  . Drug use: Never  . Sexual activity: Yes  Other Topics Concern  . Not on file  Social History Narrative   ** Merged History Encounter **       Social Determinants of Health   Financial Resource Strain:   . Difficulty of Paying Living Expenses: Not on file  Food Insecurity:   . Worried About Programme researcher, broadcasting/film/video in the Last Year: Not on file  . Ran Out of Food in the Last Year: Not on file  Transportation Needs:   .  Lack of Transportation (Medical): Not on file  . Lack of Transportation (Non-Medical): Not on file  Physical Activity:   . Days of Exercise per Week: Not on file  . Minutes of Exercise per Session: Not on file  Stress:   . Feeling of Stress : Not on file  Social Connections:   . Frequency of Communication with Friends and Family: Not on file  . Frequency of Social Gatherings with Friends and Family: Not on file  . Attends Religious  Services: Not on file  . Active Member of Clubs or Organizations: Not on file  . Attends Banker Meetings: Not on file  . Marital Status: Not on file  Intimate Partner Violence:   . Fear of Current or Ex-Partner: Not on file  . Emotionally Abused: Not on file  . Physically Abused: Not on file  . Sexually Abused: Not on file  Trans F > M Works at [employer], significant animal dander exposure.  Some chemical exposure.  Has lived in [place].  No travel or military   Allergies  Allergen Reactions  . Bee Pollen Cough  . Peanut-Containing Drug Products     Pt avoids all nuts  . Pollen Extract Cough     Outpatient Medications Prior to Visit  Medication Sig Dispense Refill  . albuterol (PROVENTIL HFA;VENTOLIN HFA) 108 (90 BASE) MCG/ACT inhaler Inhale 1 puff into the lungs every 6 (six) hours as needed for wheezing or shortness of breath.    Marland Kitchen ibuprofen (ADVIL,MOTRIN) 800 MG tablet Take 1 tablet (800 mg total) by mouth 3 (three) times daily. 21 tablet 0  . methylphenidate 54 MG PO CR tablet Take 54 mg by mouth every morning.    Marland Kitchen omeprazole (PRILOSEC) 40 MG capsule Take 40 mg by mouth daily.    Marland Kitchen testosterone cypionate (DEPOTESTOSTERONE CYPIONATE) 200 MG/ML injection ADMINISTER 0.4 ML(80 MG) IN THE MUSCLE 1 TIME A WEEK 6 mL 1   No facility-administered medications prior to visit.        Objective:   Physical Exam Vitals:   12/30/19 1150  BP: 130/72  Pulse: 99  Temp: (!) 97.2 F (36.2 C)  SpO2: 99%  Weight: 147 lb 12.8 oz (67 kg)  Height: 5\' 7"  (1.702 m)   Gen: Pleasant, thin, well-nourished, in no distress,  normal affect  ENT: No lesions,  mouth clear,  oropharynx clear, no postnasal drip  Neck: No JVD, no stridor  Lungs: No use of accessory muscles, no crackles or wheezing on normal respiration, no wheeze on forced expiration  Cardiovascular: RRR, heart sounds normal, no murmur or gallops, no peripheral edema  Musculoskeletal: No deformities, no cyanosis or  clubbing  Neuro: alert, awake, non focal  Skin: Warm, no lesions or rash      Assessment & Plan:  Asthma Spirometry reassuring.  Possible mild asthma, especially with irritating exposures including smoke, cleaning chemicals from work.  There may be a coexisting upper airway irritation that is contributing or possibly even causing all of the cough.  We will try pretreating with albuterol before the chemical exposure to see if this helps the cough, dyspnea.  Depending on results, frequency of use we may decide to try scheduled bronchodilator therapy or ICS therapy.  We will follow-up in 3 months to see whether the albuterol has made any impact.  , MD, PhD 12/30/2019, 12:12 PM [facility] [phone] or if no answer [phone]

## 2019-12-30 NOTE — Patient Instructions (Signed)
Please start using albuterol 2 puffs prior to your chemical exposure at work to see if this helps control coughing, shortness of breath.  You can use albuterol up to every 4 hours if you need it.  Keep track of how often you use it and how much it helps. Follow with Dr Delton Coombes in 3 months or sooner if you have any problems.

## 2020-03-15 ENCOUNTER — Ambulatory Visit: Payer: 59 | Admitting: Emergency Medicine

## 2020-08-29 DIAGNOSIS — F909 Attention-deficit hyperactivity disorder, unspecified type: Secondary | ICD-10-CM | POA: Diagnosis not present

## 2020-08-29 DIAGNOSIS — Z79899 Other long term (current) drug therapy: Secondary | ICD-10-CM | POA: Diagnosis not present

## 2020-08-29 DIAGNOSIS — J45909 Unspecified asthma, uncomplicated: Secondary | ICD-10-CM | POA: Diagnosis not present

## 2020-08-29 DIAGNOSIS — F641 Gender identity disorder in adolescence and adulthood: Secondary | ICD-10-CM | POA: Diagnosis not present

## 2020-08-29 DIAGNOSIS — F649 Gender identity disorder, unspecified: Secondary | ICD-10-CM | POA: Diagnosis not present

## 2020-08-29 DIAGNOSIS — F32A Depression, unspecified: Secondary | ICD-10-CM | POA: Diagnosis not present

## 2020-08-29 DIAGNOSIS — Z79890 Hormone replacement therapy (postmenopausal): Secondary | ICD-10-CM | POA: Diagnosis not present

## 2020-10-06 ENCOUNTER — Other Ambulatory Visit: Payer: Self-pay

## 2020-10-06 ENCOUNTER — Ambulatory Visit (INDEPENDENT_AMBULATORY_CARE_PROVIDER_SITE_OTHER): Payer: BC Managed Care – PPO | Admitting: Allergy & Immunology

## 2020-10-06 VITALS — BP 130/72 | HR 112 | Temp 98.7°F | Resp 18 | Ht 68.0 in | Wt 168.2 lb

## 2020-10-06 DIAGNOSIS — J301 Allergic rhinitis due to pollen: Secondary | ICD-10-CM | POA: Diagnosis not present

## 2020-10-06 DIAGNOSIS — J4540 Moderate persistent asthma, uncomplicated: Secondary | ICD-10-CM | POA: Diagnosis not present

## 2020-10-06 MED ORDER — TRIAMCINOLONE ACETONIDE 0.1 % EX OINT: 1.0000 "application " | TOPICAL_OINTMENT | Freq: Two times a day (BID) | CUTANEOUS | 1 refills | Status: DC

## 2020-10-06 NOTE — Progress Notes (Signed)
NEW PATIENT  Date of Service/Encounter:  10/07/20  Consult requested by: Maurice Small, MD   Assessment:   Moderate persistent asthma, uncomplicated   Seasonal allergic rhinitis due to pollen (trees)  Plan/Recommendations:   1. Moderate persistent asthma, uncomplicated - Lung testing looked fairly good but it got 7% better with the albuterol treatment.  - We are going to start Breztri two puffs in the morning to see if this can help with exercise intolerance. - Continue with albuterol 2 puffs every 4-6 hours as needed (and 15 minutes before physical activity).   2. Seasonal allergic rhinitis due to pollen - We did not do testing since this was done within 5 years. - The cetirizine 68m daily for the itching will help with your allergy symptoms.   3. Eczema  - We are going to start triamcinolone ointment twice daily to see if this helps with your skin. - Avoid putting it on your face. - Continue with moisturizing twice daily. - We are adding on cetirizine 159mdaily every day to help with itch control.   4. Return in about 4 weeks (around 11/03/2020).    This note in its entirety was forwarded to the Provider who requested this consultation.  Subjective:   Rebecca Clay a [age].o. adult presenting today for evaluation of  Chief Complaint  Patient presents with   Establish Care    Patient in today to establish care for ongoing asthma.    Rebecca Clay a history of the following: Patient Active Problem List   Diagnosis Date Noted   Asthma 07/03/2019   Allergic rhinitis 07/03/2019   GERD (gastroesophageal reflux disease) 07/03/2019   Dyspnea 08/11/2018   Chronic cough 08/11/2018   Female-to-female transgender person 11/06/2017   High risk medication use 11/06/2017   Long term (current) use of systemic steroids 11/06/2017   MDD (major depressive disorder), recurrent severe, without psychosis ([facility]/26/2015   Suicidal ideations 04/08/2013   Overdose 04/08/2013     History obtained from: chart review and patient.  Rebecca Clay referred by WeMaurice SmallMD.   History obtained from: chart review and patient.  Rebecca Clay a 89.o. adult presenting for an evaluation of asthma and allergies. He was previously followed at [facility], but he wanted another opinion.   Asthma/Respiratory Symptom History: He has a history of asthma. He was diagnosed back in 2013. [facility] He had met a friend of his who exercises a lot. At that time, he would wheeze. He went to the health center and he got albuterol. This did help a lot with his symptoms. He thinks that the albuterol does help him. He was doing two puffs and then they switched because he was working at a [employer] recently switched jobs and he is doing better overall. He was exposed to weed from his downstairs neighbors. He was on the Symbicort in the past. He does have BCBS through [employer] He does have a history of reflux and is on omeprazole.  Allergic Rhinitis Symptom History: The dander from the animals at the animal hospital really bothered him. He was allergic to dogs at some point. This was in 2018. He had some pollen allergens.   Review of the testing showed sensitizations to pecan, oak, tree mix via intradermals. He had testing via prick testing that was positive to ash, red cedar, and birch.   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 His chemical exposures are much less than they were in the past. He does have an N95 that he can wear which helps with exposures as well. He never completed his undergraduate degree.  But he seems happy about where he is.  Eczema Symptom History: He does have a history of dry skin .He has some itching around his ankles and on his knees.   Patient is transgender and is undergoing female to female transition.  As part of that, he is getting testosterone injections.  He started transitioning in  2017.  Otherwise, there is no history of other atopic diseases, including food allergies, drug allergies, stinging insect allergies, eczema, urticaria, or contact dermatitis. There is no significant infectious history. Vaccinations are up to date.    Past Medical History: Patient Active Problem List   Diagnosis Date Noted   Asthma 07/03/2019   Allergic rhinitis 07/03/2019   GERD (gastroesophageal reflux disease) 07/03/2019   Dyspnea 08/11/2018   Chronic cough 08/11/2018   Female-to-female transgender person 11/06/2017   High risk medication use 11/06/2017   Long term (current) use of systemic steroids 11/06/2017   MDD (major depressive disorder), recurrent severe, without psychosis ([place]) 04/09/2013   Suicidal ideations 04/08/2013   Overdose 04/08/2013    Medication List:  Allergies as of 10/06/2020       Reactions   Bee Pollen Cough   Peanut-containing Drug Products    Pt avoids all nuts   Pollen Extract Cough        Medication List        Accurate as of October 06, 2020 11:59 PM. If you have any questions, ask your nurse or doctor.          albuterol 108 (90 Base) MCG/ACT inhaler Commonly known as: VENTOLIN HFA Inhale 1 puff into the lungs every 6 (six) hours as needed for wheezing or shortness of breath.   ibuprofen 800 MG tablet Commonly known as: ADVIL Take 1 tablet (800 mg total) by mouth 3 (three) times daily.   methylphenidate 54 MG CR tablet Commonly known as: CONCERTA Take 54 mg by mouth every morning.   omeprazole 40 MG capsule Commonly known as: PRILOSEC Take 40 mg by mouth daily.   testosterone cypionate 200 MG/ML injection Commonly known as: DEPOTESTOSTERONE CYPIONATE ADMINISTER 0.4 ML(80 MG) IN THE MUSCLE 1 TIME A WEEK   triamcinolone ointment 0.1 % Commonly known as: KENALOG Apply 1 application topically 2 (two) times daily. Started by: Valentina Shaggy, MD        Birth History: non-contributory  Developmental History:  non-contributory  Past Surgical History: Past Surgical History:  Procedure Laterality Date   KNEE ARTHROSCOPY WITH ANTERIOR CRUCIATE LIGAMENT (ACL) REPAIR Right 2007     Family History: Family History  Problem Relation Age of Onset   Healthy Mother    Hyperlipidemia Father    Hypertension Father    Healthy Sister    Hyperlipidemia Maternal Grandmother    Cancer Maternal Grandfather    Hyperlipidemia Maternal Grandfather    Diabetes Paternal Grandfather    Hyperlipidemia Paternal Grandfather      Social History: He lives in an apartment that is 31 years old.  There is carpeting throughout the apartment.  They have electric heating and central cooling.  There is a cat, dog, frogs, fish, and gecko inside of the home.  There are no dust mite covers on the bedding.  There is no tobacco exposure.  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  He is exposed to fumes and chemicals.  He does not use a HEPA filter.  He does not live near an interstate or industrial area.   Review of Systems  Constitutional: Negative.  Negative for chills, fever, malaise/fatigue and weight loss.  HENT:  Positive for congestion. Negative for ear discharge and ear pain.   Eyes:  Negative for pain, discharge and redness.  Respiratory:  Positive for cough and shortness of breath. Negative for sputum production and wheezing.   Cardiovascular: Negative.  Negative for chest pain and palpitations.  Gastrointestinal:  Negative for abdominal pain, constipation, diarrhea, heartburn, nausea and vomiting.  Skin: Negative.  Negative for itching and rash.  Neurological:  Negative for dizziness and headaches.  Endo/Heme/Allergies:  Negative for environmental allergies. Does not bruise/bleed easily.      Objective:   Blood pressure 130/72, pulse (!) 112, temperature 98.7 F (37.1 C), temperature source Temporal, resp. rate 18, height 5' 8"  (1.727 m), weight 168 lb 3.2 oz (76.3 kg), SpO2 97  %. Body mass index is 25.57 kg/m.   Physical Exam:   Physical Exam Vitals reviewed.  Constitutional:      Appearance: He is well-developed.  HENT:     Head: Normocephalic and atraumatic.     Right Ear: Tympanic membrane, ear canal and external ear normal. No drainage, swelling or tenderness. Tympanic membrane is not injected, scarred, erythematous, retracted or bulging.     Left Ear: Tympanic membrane, ear canal and external ear normal. No drainage, swelling or tenderness. Tympanic membrane is not injected, scarred, erythematous, retracted or bulging.     Nose: No nasal deformity, septal deviation, mucosal edema or rhinorrhea.     Right Turbinates: Enlarged, swollen and pale.     Left Turbinates: Enlarged, swollen and pale.     Right Sinus: No maxillary sinus tenderness or frontal sinus tenderness.     Left Sinus: No maxillary sinus tenderness or frontal sinus tenderness.     Mouth/Throat:     Mouth: Mucous membranes are not pale and not dry.     Pharynx: Uvula midline.  Eyes:     General:        Right eye: No discharge.        Left eye: No discharge.     Conjunctiva/sclera: Conjunctivae normal.     Right eye: Right conjunctiva is not injected. No chemosis.    Left eye: Left conjunctiva is not injected. No chemosis.    Pupils: Pupils are equal, round, and reactive to light.  Cardiovascular:     Rate and Rhythm: Normal rate and regular rhythm.     Heart sounds: Normal heart sounds.  Pulmonary:     Effort: Pulmonary effort is normal. No tachypnea, accessory muscle usage or respiratory distress.     Breath sounds: Normal breath sounds. No wheezing, rhonchi or rales.     Comments: Moving air well in all lung fields.  No increased work of breathing. No wheezing or crackles noted.  Chest:     Chest wall: No tenderness.  Abdominal:     Tenderness: There is no abdominal tenderness. There is no guarding or rebound.  Lymphadenopathy:     Head:     Right side of head: No  submandibular, tonsillar or occipital adenopathy.     Left side of head: No submandibular, tonsillar or occipital adenopathy.     Cervical: No cervical adenopathy.  Skin:    Coloration: Skin is not pale.     Findings:  No abrasion, erythema, petechiae or rash. Rash is not papular, urticarial or vesicular.  Neurological:     Mental Status: He is alert.  Psychiatric:        Behavior: Behavior is cooperative.     Diagnostic studies:   Spirometry: results normal (FEV1: 4.10/98%, FVC: 4.44/86%, FEV1/FVC: 92%).    Spirometry consistent with normal pattern. Albuterol four puffs via MDI treatment given in clinic with improvement in FEV1 and FVC, but not significant per ATS criteria.   Allergy Studies: none            Salvatore Marvel, MD [facility]

## 2020-10-06 NOTE — Patient Instructions (Addendum)
1. Moderate persistent asthma, uncomplicated - Lung testing looked fairly good but it got 7% better with the albuterol treatment.  - We are going to start Breztri two puffs in the morning to see if this can help with exercise intolerance. - Continue with albuterol 2 puffs every 4-6 hours as needed (and 15 minutes before physical activity).   2. Seasonal allergic rhinitis due to pollen - We did not do testing since this was done within 5 years. - The cetirizine 10mg  daily for the itching will help with your allergy symptoms.   3. Eczema  - We are going to start triamcinolone ointment twice daily to see if this helps with your skin. - Avoid putting it on your face. - Continue with moisturizing twice daily. - We are adding on cetirizine 10mg  daily every day to help with itch control.   4. Return in about 4 weeks (around 11/03/2020).    Please inform of any Emergency Department visits, hospitalizations, or changes in symptoms. Call 11/05/2020 before going to the ED for breathing or allergy symptoms since we might be able to fit you in for a sick visit. Feel free to contact us anytime with any questions, problems, or concerns.  It was a pleasure to meet you today!  Websites that have reliable patient information: 1. American Academy of Asthma, Allergy, and Immunology: www.aaaai.org 2. Food Allergy Research and Education (FARE): foodallergy.org 3. Mothers of Asthmatics: http://www.asthmacommunitynetwork.org 4. American College of Allergy, Asthma, and Immunology: www.acaai.org   COVID-19 Vaccine Information can be found at: [place] For questions related to vaccine distribution or appointments, please email [email or web address] or call [phone].   We realize that you might be concerned about having an allergic reaction to the COVID19 vaccines. To help with that concern, WE ARE OFFERING THE COVID19 VACCINES IN OUR OFFICE! Ask  the front desk for dates!     "Like" [email or web address] on Facebook and Instagram for our latest updates!      A healthy democracy works best when [phone] participate! Make sure you are registered to vote! If you have moved or changed any of your contact information, you will need to get this updated before voting!  In some cases, you MAY be able to register to vote online: [place]

## 2020-10-07 ENCOUNTER — Encounter: Payer: Self-pay | Admitting: Allergy & Immunology

## 2020-10-12 DIAGNOSIS — F640 Transsexualism: Secondary | ICD-10-CM | POA: Diagnosis not present

## 2020-11-03 ENCOUNTER — Encounter: Payer: Self-pay | Admitting: Allergy & Immunology

## 2020-11-03 ENCOUNTER — Other Ambulatory Visit: Payer: Self-pay

## 2020-11-03 ENCOUNTER — Ambulatory Visit: Payer: BC Managed Care – PPO | Admitting: Allergy & Immunology

## 2020-11-03 VITALS — BP 132/70 | HR 105 | Temp 98.4°F | Resp 16 | Ht 68.0 in | Wt 171.0 lb

## 2020-11-03 DIAGNOSIS — J301 Allergic rhinitis due to pollen: Secondary | ICD-10-CM | POA: Diagnosis not present

## 2020-11-03 DIAGNOSIS — J4540 Moderate persistent asthma, uncomplicated: Secondary | ICD-10-CM

## 2020-11-03 MED ORDER — BREZTRI AEROSPHERE 160-9-4.8 MCG/ACT IN AERO: 2.0000 | INHALATION_SPRAY | Freq: Every morning | RESPIRATORY_TRACT | 5 refills | Status: DC

## 2020-11-03 NOTE — Addendum Note (Signed)
Addended by: Tawnya Crook on: 11/03/2020 05:01 PM   Modules accepted: Orders

## 2020-11-03 NOTE — Patient Instructions (Addendum)
1. Moderate persistent asthma, uncomplicated - Lung testing looked fairly good today. - I am glad that the [name] has helped you to be able to tolerate physical activity.  - I sent this in, but CALL us if it is too expensive.   2. Seasonal allergic rhinitis due to pollen - The cetirizine 10mg  daily for the itching will help with your allergy symptoms.   3. Eczema  - Continue with triamcinolone ointment twice daily to see if this helps with your skin. - Avoid putting it on your face. - Continue with moisturizing twice daily. - Continue with cetirizine 10mg  daily every day to help with itch control.   4. Return in about 6 months (around 05/03/2021).    Please inform of any Emergency Department visits, hospitalizations, or changes in symptoms. Call 05/05/2021 before going to the ED for breathing or allergy symptoms since we might be able to fit you in for a sick visit. Feel free to contact us anytime with any questions, problems, or concerns.  It was a pleasure to meet you today!  Websites that have reliable patient information: 1. American Academy of Asthma, Allergy, and Immunology: www.aaaai.org 2. Food Allergy Research and Education (FARE): foodallergy.org 3. Mothers of Asthmatics: http://www.asthmacommunitynetwork.org 4. American College of Allergy, Asthma, and Immunology: www.acaai.org   COVID-19 Vaccine Information can be found at: [place] For questions related to vaccine distribution or appointments, please email [email or web address] or call [phone].   We realize that you might be concerned about having an allergic reaction to the COVID19 vaccines. To help with that concern, WE ARE OFFERING THE COVID19 VACCINES IN OUR OFFICE! Ask the front desk for dates!     "Like" [email or web address] on Facebook and Instagram for our latest updates!      A healthy democracy works best when [phone] participate! Make sure you are  registered to vote! If you have moved or changed any of your contact information, you will need to get this updated before voting!  In some cases, you MAY be able to register to vote online: [place]

## 2020-11-03 NOTE — Progress Notes (Signed)
FOLLOW UP  Date of Service/Encounter:  11/03/20   Assessment:   Moderate persistent asthma, uncomplicated    Seasonal allergic rhinitis due to pollen (trees)   Rebecca Clay is doing very well.  We are using Breztri in the rather unorthodox way, but it has helped him to be able to do more from exercise perspective.  He is activities of daily living are no longer exhausting as they were.  We are going to send in the [name] and we did provide him with a co-pay card.  I did ask him to call us if it was too expensive and we can substitute with something that might be more affordable.  I am hopeful with a co-pay card that will well.  His skin is doing very well on the current regimen.  We will continue with for now.  Plan/Recommendations:   1. Moderate persistent asthma, uncomplicated - Lung testing looked fairly good today. - I am glad that the Rebecca Clay has helped you to be able to tolerate physical activity.  - I sent this in, but CALL us if it is too expensive.   2. Seasonal allergic rhinitis due to pollen - The cetirizine 10mg  daily for the itching will help with your allergy symptoms.   3. Eczema  - Continue with triamcinolone ointment twice daily to see if this helps with your skin. - Avoid putting it on your face. - Continue with moisturizing twice daily. - Continue with cetirizine 10mg  daily every day to help with itch control.   4. Return in about 6 months (around 05/03/2021).     Subjective:   Rebecca Clay is a 30 y.o. adult presenting today for follow up of  Chief Complaint  Patient presents with   Follow-up    Rebecca Clay has a history of the following: Patient Active Problem List   Diagnosis Date Noted   Asthma 07/03/2019   Allergic rhinitis 07/03/2019   GERD (gastroesophageal reflux disease) 07/03/2019   Dyspnea 08/11/2018   Chronic cough 08/11/2018   Female-to-female transgender person 11/06/2017   High risk medication use 11/06/2017   Long term (current) use  of systemic steroids 11/06/2017   MDD (major depressive disorder), recurrent severe, without psychosis ([facility]) 04/09/2013   Suicidal ideations 04/08/2013   Overdose 04/08/2013    History obtained from: chart review and patient.  Rebecca Clay is a 30 y.o. adult presenting for a follow up visit.  He was last seen in August 2022.  At that time, his lung testing looked good, but got 7% better with the albuterol treatment.  We started Breztri 2 puffs twice daily in the morning to help with exercise intolerance.  We continued with albuterol 2 puffs every 4-6 hours as needed.  There is allergic rhinitis, we did not do testing since he had previous testing done within 5 years.  We continue with cetirizine 10 mg daily.  For his eczema, we started triamcinolone mixed with Eucerin twice daily.  We also added the cetirizine to help with itch control.  Since last visit, he has done well.  Asthma/Respiratory Symptom History: He has been doing the [place] which has helped with exercise tolerance.  He has been able to do a lot more walking.  He has not gotten back onto his bike yet due to the warm weather, but he is planning to do that when he cools off.  He has not been using his rescue inhaler at all.  Skin Symptom History: Skin is overall doing better. He does use  the triamcinolone twice daily.  He does have acne on the face, but he does not use the TAX on the face. Cetirizine is not really causing sleepiness. He does have a hard time getting up in the morning. This is because of his new schedule. He has been doing the endoscopy cleaning for three months.   He did spend some time in the [employer] this weekend.  Otherwise, there have been no changes to his past medical history, surgical history, family history, or social history.    Review of Systems  Constitutional: Negative.  Negative for fever, malaise/fatigue and weight loss.  HENT: Negative.  Negative for congestion, ear discharge and ear pain.   Eyes:   Negative for pain, discharge and redness.  Respiratory:  Positive for shortness of breath. Negative for cough, sputum production and wheezing.   Cardiovascular: Negative.  Negative for chest pain and palpitations.  Gastrointestinal:  Negative for abdominal pain, blood in stool, constipation, diarrhea, heartburn, nausea and vomiting.  Skin: Negative.  Negative for itching and rash.  Neurological:  Negative for dizziness and headaches.  Endo/Heme/Allergies:  Negative for environmental allergies. Does not bruise/bleed easily.      Objective:   Blood pressure 132/70, pulse (!) 105, temperature 98.4 F (36.9 C), temperature source Temporal, resp. rate 16, height 5\' 8"  (1.727 m), weight 171 lb (77.6 kg), SpO2 98 %. Body mass index is 26 kg/m.   Physical Exam:  Physical Exam Constitutional:      Appearance: He is well-developed.  HENT:     Head: Normocephalic and atraumatic.     Right Ear: Tympanic membrane, ear canal and external ear normal.     Left Ear: Tympanic membrane, ear canal and external ear normal.     Nose: No nasal deformity, septal deviation, mucosal edema or rhinorrhea.     Right Turbinates: Not enlarged or swollen.     Left Turbinates: Not enlarged or swollen.     Right Sinus: No maxillary sinus tenderness or frontal sinus tenderness.     Left Sinus: No maxillary sinus tenderness or frontal sinus tenderness.     Mouth/Throat:     Mouth: Mucous membranes are not pale and not dry.     Pharynx: Uvula midline.  Eyes:     General: Lids are normal. No allergic shiner.       Right eye: No discharge.        Left eye: No discharge.     Conjunctiva/sclera: Conjunctivae normal.     Right eye: Right conjunctiva is not injected. No chemosis.    Left eye: Left conjunctiva is not injected. No chemosis.    Pupils: Pupils are equal, round, and reactive to light.  Cardiovascular:     Rate and Rhythm: Normal rate and regular rhythm.     Heart sounds: Normal heart sounds.   Pulmonary:     Effort: Pulmonary effort is normal. No tachypnea, accessory muscle usage or respiratory distress.     Breath sounds: Normal breath sounds. No wheezing, rhonchi or rales.  Chest:     Chest wall: No tenderness.  Lymphadenopathy:     Cervical: No cervical adenopathy.  Skin:    General: Skin is warm.     Capillary Refill: Capillary refill takes less than 2 seconds.     Coloration: Skin is not pale.     Findings: No abrasion, erythema, petechiae or rash. Rash is not papular, urticarial or vesicular.     Comments: He does have some lesions with eschars  on his lower legs, which are secondary to injury sustained from an exercise routine.  However, the eczematous lesions are mostly resolved.  Neurological:     Mental Status: He is alert.  Psychiatric:        Behavior: Behavior is cooperative.     Diagnostic studies: none     Malachi Bonds, MD  [facility]

## 2020-11-09 ENCOUNTER — Telehealth: Payer: Self-pay | Admitting: Allergy & Immunology

## 2020-11-09 ENCOUNTER — Other Ambulatory Visit: Payer: Self-pay | Admitting: *Deleted

## 2020-11-09 MED ORDER — BREZTRI AEROSPHERE 160-9-4.8 MCG/ACT IN AERO: 2.0000 | INHALATION_SPRAY | Freq: Two times a day (BID) | RESPIRATORY_TRACT | 5 refills | Status: AC

## 2020-11-09 MED ORDER — BREZTRI AEROSPHERE 160-9-4.8 MCG/ACT IN AERO: 2.0000 | INHALATION_SPRAY | Freq: Every morning | RESPIRATORY_TRACT | 5 refills | Status: DC

## 2020-11-09 NOTE — Telephone Encounter (Signed)
Called and spoke with the pharmacy and they stated that with the COPAY card it is going to be $80. Can you recommend some other inhalers and we can look at coverage? Thank You.

## 2020-11-09 NOTE — Telephone Encounter (Signed)
Pt states he is having trouble getting Breztri from his pharmacy. Insurance is not wanting to pay for it. Patient states he provided pharmacist with copay card and they are having trouble with that too. Patient is wondering if something that is more affordable could be sent in for him.   [facility] [address] [place] [address]  Best contact number: [phone]

## 2020-11-10 MED ORDER — TRELEGY ELLIPTA 100-62.5-25 MCG/INH IN AEPB: 1.0000 | INHALATION_SPRAY | Freq: Every day | RESPIRATORY_TRACT | 5 refills | Status: DC

## 2020-11-10 NOTE — Telephone Encounter (Signed)
That is way too expensive.  I am glad that he called.  Can we change him to Trelegy one puff once daily? We could maybe give him samples?  Malachi Bonds, MD [facility]

## 2020-11-10 NOTE — Telephone Encounter (Signed)
Sent in trelegy 100 1 puff daily to pts pharmacy tried to call pt about change but voicemail box is full

## 2020-11-14 DIAGNOSIS — B349 Viral infection, unspecified: Secondary | ICD-10-CM | POA: Diagnosis not present

## 2020-11-21 DIAGNOSIS — F330 Major depressive disorder, recurrent, mild: Secondary | ICD-10-CM | POA: Diagnosis not present

## 2020-12-19 DIAGNOSIS — F330 Major depressive disorder, recurrent, mild: Secondary | ICD-10-CM | POA: Diagnosis not present

## 2021-01-16 DIAGNOSIS — J309 Allergic rhinitis, unspecified: Secondary | ICD-10-CM | POA: Diagnosis not present

## 2021-01-16 DIAGNOSIS — R5382 Chronic fatigue, unspecified: Secondary | ICD-10-CM | POA: Diagnosis not present

## 2021-01-16 DIAGNOSIS — J4541 Moderate persistent asthma with (acute) exacerbation: Secondary | ICD-10-CM | POA: Diagnosis not present

## 2021-01-16 DIAGNOSIS — F649 Gender identity disorder, unspecified: Secondary | ICD-10-CM | POA: Diagnosis not present

## 2021-03-20 ENCOUNTER — Telehealth: Payer: Self-pay

## 2021-03-20 MED ORDER — TRELEGY ELLIPTA 100-62.5-25 MCG/ACT IN AEPB: 1.0000 | INHALATION_SPRAY | Freq: Every day | RESPIRATORY_TRACT | 6 refills | Status: DC

## 2021-03-20 NOTE — Telephone Encounter (Signed)
Sent in rx informed pt of change and verified pharmacy

## 2021-03-20 NOTE — Telephone Encounter (Signed)
He has Nurse, learning disability. The copay card should work, right?   Otherwise we can change to Trelegy one puff once daily.   Malachi Bonds, MD [facility]

## 2021-03-20 NOTE — Telephone Encounter (Signed)
Patient called stating Breztri inhaler isn't covered under his new insurance and needs a different inhaler that is covered.   [phone]  [facility]

## 2021-03-20 NOTE — Addendum Note (Signed)
Addended by: Berna Bue on: 03/20/2021 04:32 PM   Modules accepted: Orders

## 2021-03-23 ENCOUNTER — Telehealth: Payer: Self-pay | Admitting: *Deleted

## 2021-03-23 NOTE — Telephone Encounter (Signed)
PA has been submitted through CoverMyMeds for Trelegy and is currently pending approval/denial.  

## 2021-03-24 NOTE — Telephone Encounter (Signed)
PA is still pending.  

## 2021-03-28 NOTE — Telephone Encounter (Signed)
PA has been renewed, will keep an eye out for questions for PA.

## 2021-03-28 NOTE — Telephone Encounter (Signed)
[employer] meds called and said that the prio auth. Has expired and we need to do a new one on Trelegy . There number is [phone].

## 2021-03-28 NOTE — Telephone Encounter (Signed)
PA has been submitted through CoverMyMeds and is currently pending approval/denial.  

## 2021-03-29 NOTE — Telephone Encounter (Signed)
PA has been denied for Trelegy stating that the preferred alternatives are Advair Diskus, Symbicort, Spiriva Respimat. Do you want to switch to any of these medications?

## 2021-03-30 NOTE — Telephone Encounter (Signed)
PA has been resubmitted through CoverMyMeds for Trelegy even though I cannot guarantee approval since the patient has not tried and failed these other medications.

## 2021-03-30 NOTE — Telephone Encounter (Signed)
No I really want Trelegy since he has been stable on that. I hate playing these dumb games with insurance companies. Per usual, insurance companies suck.  Malachi Bonds, MD [facility]

## 2021-03-31 ENCOUNTER — Other Ambulatory Visit: Payer: Self-pay | Admitting: *Deleted

## 2021-03-31 MED ORDER — SYMBICORT 160-4.5 MCG/ACT IN AERO: 2.0000 | INHALATION_SPRAY | Freq: Two times a day (BID) | RESPIRATORY_TRACT | 5 refills | Status: DC

## 2021-03-31 NOTE — Telephone Encounter (Signed)
Spoke with Dr. Dellis Anes and he decided to send in Symbicort 160 2 puffs BID. This has been sent in to the patients pharmacy. Called and left a detailed voicemail per DPR permission advising of change in inhaler and asked to call back with any questions or concerns.

## 2021-05-04 ENCOUNTER — Other Ambulatory Visit: Payer: Self-pay

## 2021-05-04 ENCOUNTER — Ambulatory Visit (INDEPENDENT_AMBULATORY_CARE_PROVIDER_SITE_OTHER): Payer: No Typology Code available for payment source | Admitting: Allergy & Immunology

## 2021-05-04 ENCOUNTER — Encounter: Payer: Self-pay | Admitting: Allergy & Immunology

## 2021-05-04 VITALS — BP 120/62 | HR 90 | Temp 98.0°F | Resp 18

## 2021-05-04 DIAGNOSIS — L2089 Other atopic dermatitis: Secondary | ICD-10-CM | POA: Diagnosis not present

## 2021-05-04 DIAGNOSIS — J4540 Moderate persistent asthma, uncomplicated: Secondary | ICD-10-CM

## 2021-05-04 DIAGNOSIS — J301 Allergic rhinitis due to pollen: Secondary | ICD-10-CM

## 2021-05-04 NOTE — Progress Notes (Signed)
? ?FOLLOW UP ? ?Date of Service/Encounter:  05/04/21 ? ? ?Assessment:  ? ?Moderate persistent asthma, uncomplicated  ?  ?Seasonal allergic rhinitis due to pollen (trees) ? ?Major depressive disorder - on duloxetine ? ?Eczema - controlled with TAC as needed ? ?Recent social stressor - with the loss of his 32yo cat ? ?Plan/Recommendations:  ? ?1. Moderate persistent asthma, uncomplicated ?- Lung testing looked fairly good today. ?- Stop the Symbicort and start Trelegy one puff once daily (samples provided). ?- We are going to try to get this approved through your insurance.  ?- This is similar to [facility] in that it has three medications to help with asthma.  ?- Call or MyChart with updates.  ? ?2. Seasonal allergic rhinitis due to pollen ?- The cetirizine 10mg  daily for the itching will help with your allergy symptoms.  ? ?3. Eczema  ?- Continue with triamcinolone ointment twice daily as needed.  ?- Continue with moisturizing twice daily. ?- Continue with cetirizine 10mg  daily every day to help with itch control.  ? ?4. Return in about 3 months (around 08/04/2021).  ? ? ?Subjective:  ? ?Elizette Shek is a 31 y.o. adult presenting today for follow up of  ?Chief Complaint  ?Patient presents with  ? Follow-up  ? ? ?Hillary Struss has a history of the following: ?Patient Active Problem List  ? Diagnosis Date Noted  ? Asthma 07/03/2019  ? Allergic rhinitis 07/03/2019  ? GERD (gastroesophageal reflux disease) 07/03/2019  ? Dyspnea 08/11/2018  ? Chronic cough 08/11/2018  ? Female-to-female transgender person 11/06/2017  ? High risk medication use 11/06/2017  ? Long term (current) use of systemic steroids 11/06/2017  ? MDD (major depressive disorder), recurrent severe, without psychosis (HCC) 04/09/2013  ? Suicidal ideations 04/08/2013  ? Overdose 04/08/2013  ? ? ?History obtained from: chart review and patient. ? ?Tela is a 31 y.o. adult presenting for a follow up visit.  He was last seen in September 2022.  At that time, his  lung testing looked fairly good.  He was using respiratory mostly a as needed basis.  For his allergic rhinitis, he was on cetirizine 10 mg daily which was helping his symptoms.  Eczema was controlled with triamcinolone twice daily as well as moisturizing. ? ?Since the last visit, ? ?Asthma/Respiratory Symptom History: He has been on Symbicort two puffs twice daily. He had to change due to insurance coverage.  38 was working very well. Symbicort is much worse, he reports that he is having some issues with some jitteriness from the Symbicort. This lasts all day long. He is unsure whether this more from anxiety or whether this is the medication. He was not having these issues with Breztri. He had headaches from the Symbicort as well. It has helped the SOB episodes a lot. They have done a lot of walking and have been able to tolerate this more.  ? ?Apparently we tried to send in Trelegy once and this was denied. Symbicort was one of the failures required, which we have clearly failed. Spiriva and Advair were also recommended. He does not think that we gave him samples of the Trelegy. He has never been on Trelegy or Breo.   ? ?Allergic Rhinitis Symptom History: Allergic rhinitis is controlled with cetirizine. He has antibiotics at all.  He also has Nasacort to use as needed.  Overall, this is the worst time of year for him. ? ?Skin Symptom History: He is using the triamcinolone on a PRN basis. He is  using a different moisturizer, but overall, his skin is under good control.  The triamcinolone as needed has certainly helped . ? ?He has a history of anxiety and increased his duloxetine recently to 120mg  daily. His cat recently died from eating a nerf gun bullet. This occurred around 4 months ago. He has ashes in an urn at his house and he had a necklace made that contains his birth date and the date of his passing as well as some ashes inside the necklace. He also has a patch of the cat's hair.  ? ?Otherwise, there  have been no changes to his past medical history, surgical history, family history, or social history. ? ? ? ?Review of Systems  ?Constitutional: Negative.  Negative for fever, malaise/fatigue and weight loss.  ?HENT: Negative.  Negative for congestion, ear discharge and ear pain.   ?Eyes:  Negative for pain, discharge and redness.  ?Respiratory:  Positive for shortness of breath. Negative for cough, sputum production and wheezing.   ?Cardiovascular: Negative.  Negative for chest pain and palpitations.  ?Gastrointestinal:  Negative for abdominal pain, constipation, diarrhea, heartburn, nausea and vomiting.  ?Skin: Negative.  Negative for itching and rash.  ?Neurological:  Negative for dizziness and headaches.  ?Endo/Heme/Allergies:  Negative for environmental allergies. Does not bruise/bleed easily.   ? ? ? ?Objective:  ? ?Blood pressure 120/62, pulse 90, temperature 98 ?F (36.7 ?C), temperature source Temporal, resp. rate 18, SpO2 98 %. ?There is no height or weight on file to calculate BMI. ? ? ? ?Physical Exam ?Constitutional:   ?   Appearance: He is well-developed.  ?HENT:  ?   Head: Normocephalic and atraumatic.  ?   Right Ear: Tympanic membrane, ear canal and external ear normal.  ?   Left Ear: Tympanic membrane, ear canal and external ear normal.  ?   Nose: No nasal deformity, septal deviation, mucosal edema or rhinorrhea.  ?   Right Turbinates: Not enlarged or swollen.  ?   Left Turbinates: Not enlarged or swollen.  ?   Right Sinus: No maxillary sinus tenderness or frontal sinus tenderness.  ?   Left Sinus: No maxillary sinus tenderness or frontal sinus tenderness.  ?   Mouth/Throat:  ?   Mouth: Mucous membranes are not pale and not dry.  ?   Pharynx: Uvula midline.  ?Eyes:  ?   General: Lids are normal. No allergic shiner.    ?   Right eye: No discharge.     ?   Left eye: No discharge.  ?   Conjunctiva/sclera: Conjunctivae normal.  ?   Right eye: Right conjunctiva is not injected. No chemosis. ?   Left eye:  Left conjunctiva is not injected. No chemosis. ?   Pupils: Pupils are equal, round, and reactive to light.  ?Cardiovascular:  ?   Rate and Rhythm: Normal rate and regular rhythm.  ?   Heart sounds: Normal heart sounds.  ?Pulmonary:  ?   Effort: Pulmonary effort is normal. No tachypnea, accessory muscle usage or respiratory distress.  ?   Breath sounds: Normal breath sounds. No wheezing, rhonchi or rales.  ?Chest:  ?   Chest wall: No tenderness.  ?Lymphadenopathy:  ?   Cervical: No cervical adenopathy.  ?Skin: ?   General: Skin is warm.  ?   Capillary Refill: Capillary refill takes less than 2 seconds.  ?   Coloration: Skin is not pale.  ?   Findings: No abrasion, erythema, petechiae or rash. Rash is not  papular, urticarial or vesicular.  ?   Comments: Eczematous lesions are limited and rare. Overall his skin looks great.   ?Neurological:  ?   Mental Status: He is alert.  ?Psychiatric:     ?   Behavior: Behavior is cooperative.  ?  ? ?Diagnostic studies:  ? ?Spirometry: results normal (FEV1: 3.93/94%, FVC: 4.358/87%, FEV1/FVC: 90%).  ?  ?Spirometry consistent with normal pattern.  ? ?Allergy Studies: none ? ? ? ? ? ?  ?Malachi BondsJoel , MD  ?[facility] ? ? ? ? ? ? ?

## 2021-05-04 NOTE — Patient Instructions (Addendum)
1. Moderate persistent asthma, uncomplicated ?- Lung testing looked fairly good today. ?- Stop the Symbicort and start Trelegy one puff once daily (samples provided). ?- We are going to try to get this approved through your insurance.  ?- This is similar to [facility] in that it has three medications to help with asthma.  ?- Call or MyChart with updates.  ? ?2. Seasonal allergic rhinitis due to pollen ?- The cetirizine 10mg  daily for the itching will help with your allergy symptoms.  ? ?3. Eczema  ?- Continue with triamcinolone ointment twice daily as needed.  ?- Continue with moisturizing twice daily. ?- Continue with cetirizine 10mg  daily every day to help with itch control.  ? ?4. Return in about 3 months (around 08/04/2021).  ? ? ?Please inform us of any Emergency Department visits, hospitalizations, or changes in symptoms. Call us before going to the ED for breathing or allergy symptoms since we might be able to fit you in for a sick visit. Feel free to contact us anytime with any questions, problems, or concerns. ? ?It was a pleasure to see you again today! ? ?Websites that have reliable patient information: ?1. American Academy of Asthma, Allergy, and Immunology: www.aaaai.org ?2. Food Allergy Research and Education (FARE): foodallergy.org ?3. Mothers of Asthmatics: http://www.asthmacommunitynetwork.org ?4. [employer] of Allergy, Asthma, and Immunology: MonthlyElectricBill.co.uk ? ? ?COVID-19 Vaccine Information can be found at: ShippingScam.co.uk For questions related to vaccine distribution or appointments, please email [email or web address] or call [phone].  ? ?We realize that you might be concerned about having an allergic reaction to the COVID19 vaccines. To help with that concern, WE ARE OFFERING THE COVID19 VACCINES IN OUR OFFICE! Ask the front desk for dates!  ? ? ? ??Like? [place] on Facebook and Instagram for our latest updates!  ?  ? ? ?A healthy  democracy works best when [employer] participate! Make sure you are registered to vote! If you have moved or changed any of your contact information, you will need to get this updated before voting! ? ?In some cases, you MAY be able to register to vote online: [email or web address] ? ? ? ? ? ? ? ?

## 2021-08-08 ENCOUNTER — Ambulatory Visit (INDEPENDENT_AMBULATORY_CARE_PROVIDER_SITE_OTHER): Payer: No Typology Code available for payment source | Admitting: Allergy & Immunology

## 2021-08-08 ENCOUNTER — Encounter: Payer: Self-pay | Admitting: Allergy & Immunology

## 2021-08-08 VITALS — BP 120/72 | HR 94 | Temp 98.7°F | Resp 18 | Ht 68.0 in | Wt 173.2 lb

## 2021-08-08 DIAGNOSIS — J4540 Moderate persistent asthma, uncomplicated: Secondary | ICD-10-CM | POA: Diagnosis not present

## 2021-08-08 DIAGNOSIS — J301 Allergic rhinitis due to pollen: Secondary | ICD-10-CM | POA: Diagnosis not present

## 2021-08-08 DIAGNOSIS — L2089 Other atopic dermatitis: Secondary | ICD-10-CM

## 2021-08-08 MED ORDER — TRELEGY ELLIPTA 100-62.5-25 MCG/ACT IN AEPB: 1.0000 | INHALATION_SPRAY | Freq: Every day | RESPIRATORY_TRACT | 5 refills | Status: AC

## 2021-08-08 MED ORDER — ALBUTEROL SULFATE HFA 108 (90 BASE) MCG/ACT IN AERS: 1.0000 | INHALATION_SPRAY | Freq: Four times a day (QID) | RESPIRATORY_TRACT | 5 refills | Status: DC | PRN

## 2021-09-29 ENCOUNTER — Telehealth: Payer: Self-pay | Admitting: Allergy & Immunology

## 2021-09-29 NOTE — Telephone Encounter (Signed)
Trelegy Ellipta 100-62.5-25 MCG/ACT PA...  KEY: [id number]  PA has been sent to [facility] plan. Created: 09/29/21    Waiting for Approval/Denial.

## 2021-09-29 NOTE — Telephone Encounter (Signed)
Patient called the office and states he spoke with his INS regarding Trelegy. The rep informed him that he will be able to have Trelegy covered if we submit an appeal explaining why Symbicort is no longer effective for him. The INS is suppose to be faxing over this information.   Patient would like this prescription [facility] to [facility] on [address]

## 2021-10-02 NOTE — Telephone Encounter (Signed)
PA has been renewed to include further information. PA is currently pending.

## 2021-10-02 NOTE — Telephone Encounter (Signed)
PA has ben approved for Trelegy. PA has been faxed to pharmacy, labeled, and placed in bulk scanning.

## 2021-10-02 NOTE — Telephone Encounter (Signed)
Called patient and informed, patient verbalized understanding.  

## 2021-10-26 ENCOUNTER — Telehealth: Payer: Self-pay | Admitting: Allergy & Immunology

## 2021-10-26 ENCOUNTER — Other Ambulatory Visit: Payer: Self-pay | Admitting: *Deleted

## 2021-10-26 MED ORDER — TRELEGY ELLIPTA 100-62.5-25 MCG/ACT IN AEPB: 1.0000 | INHALATION_SPRAY | Freq: Every day | RESPIRATORY_TRACT | 5 refills | Status: DC

## 2021-10-26 NOTE — Telephone Encounter (Signed)
Patient called and needs [facility] [address]  //[phone]

## 2021-10-26 NOTE — Telephone Encounter (Signed)
Refills have been sent in. Called patient and left detailed voicemail advising per [facility] permission.

## 2022-02-08 ENCOUNTER — Encounter: Payer: Self-pay | Admitting: Allergy & Immunology

## 2022-02-08 ENCOUNTER — Ambulatory Visit (INDEPENDENT_AMBULATORY_CARE_PROVIDER_SITE_OTHER): Payer: No Typology Code available for payment source | Admitting: Allergy & Immunology

## 2022-02-08 ENCOUNTER — Other Ambulatory Visit: Payer: Self-pay

## 2022-02-08 VITALS — BP 122/74 | HR 94 | Temp 98.4°F | Resp 14 | Ht 68.0 in | Wt 175.4 lb

## 2022-02-08 DIAGNOSIS — J4540 Moderate persistent asthma, uncomplicated: Secondary | ICD-10-CM | POA: Diagnosis not present

## 2022-02-08 DIAGNOSIS — L2089 Other atopic dermatitis: Secondary | ICD-10-CM

## 2022-02-08 DIAGNOSIS — J301 Allergic rhinitis due to pollen: Secondary | ICD-10-CM | POA: Diagnosis not present

## 2022-02-08 MED ORDER — CLOBETASOL PROPIONATE 0.05 % EX OINT: 1.0000 | TOPICAL_OINTMENT | Freq: Two times a day (BID) | CUTANEOUS | 3 refills | Status: DC

## 2022-02-08 MED ORDER — TRIAMCINOLONE ACETONIDE 0.1 % EX OINT: 1.0000 | TOPICAL_OINTMENT | Freq: Two times a day (BID) | CUTANEOUS | 1 refills | Status: AC | PRN

## 2022-02-08 MED ORDER — TRELEGY ELLIPTA 100-62.5-25 MCG/ACT IN AEPB: 1.0000 | INHALATION_SPRAY | Freq: Every day | RESPIRATORY_TRACT | 5 refills | Status: DC

## 2022-02-08 MED ORDER — ALBUTEROL SULFATE HFA 108 (90 BASE) MCG/ACT IN AERS: 2.0000 | INHALATION_SPRAY | Freq: Four times a day (QID) | RESPIRATORY_TRACT | 1 refills | Status: DC | PRN

## 2022-02-08 NOTE — Progress Notes (Signed)
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 Moderate persistent asthma, uncomplicated - Lung testing looked fairly good today. - Continue Trelegy one puff once daily (samples provided). - We are going to try to get this approved through your insurance.   2. Seasonal allergic rhinitis due to pollen - The cetirizine 10mg  daily for the itching will help with your allergy symptoms.   3. Eczema  - Continue with triamcinolone ointment twice daily as needed.  - Add on clobetasol ointment twice daily (max two weeks at a time). - DO NOT use the clobetasol on your face.  - Continue with moisturizing twice daily. - Continue with cetirizine 10mg  daily every day to help with itch control.  - Consider Dupixent for long term control of itching/eczema.   4. Return in about 6 months (around 08/10/2022).   Subjective:   Rebecca Clay is a 31 y.o. 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  Rebecca Clay is a 31 y.o. adult presenting for a follow up visit.  He was last seen in June 2023.  At that time, lung testing was fairly good.  We stopped Symbicort and started Trelegy 1 puff once daily.  For his allergic rhinitis, we continue with cetirizine.  Eczema was controlled with triamcinolone as well as moisturizing.  Since the last visit, he has done well.  They did not really work a lot of days because of all of the animals I had.  Apparently, their dog is 14 does not like pet sitters at all.  Asthma/Respiratory Symptom History: He remains on the Trelegy and he is getting it for free. He recently switched insurance plans.  He has not been using his rescue inhaler much at all. He has not been on prednisone. Breathing seems to be in a  good place. He is not coughing at night.  He has been more consistent with his allergy since it is affordable.  Allergic Rhinitis Symptom History: Rhinorrhea has been good. He is not using cetirizine only as needed. He has not been on antibiotics at all for his symptoms. Trelegy has helped a lot with his atopic disease.  He thinks that it has even helped with his rhinorrhea and allergic rhinitis symptoms.   Skin Symptom History: Eczema is a 31. He has triamcinolone that does not work as well on the ankle and foot lesions.  He is open to something stronger today.   These were spots  for his eczema.  He has some on his arms, but nonspecific sentences.  He has acne on his face. He is going to see a Dermatologist on January 10th for this. This is [facility]. He is unsure of who he is seeing there.  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  He is on daily testosterone, but he notes that he reports started hormone therapy, his acne was an issue.  Otherwise, there have been no changes to his past medical history, surgical history, family  history, or social history.    Review of Systems  Constitutional: Negative.  Negative for chills, fever, malaise/fatigue and weight loss.  HENT: Negative.  Negative for congestion, ear discharge and ear pain.   Eyes:  Negative for pain, discharge and redness.  Respiratory:  Negative for cough, sputum production, shortness of breath and wheezing.   Cardiovascular: Negative.  Negative for chest pain and palpitations.  Gastrointestinal:  Negative for abdominal pain, constipation, diarrhea, heartburn, nausea and vomiting.  Skin: Negative.  Negative for itching and rash.  Neurological:  Negative for dizziness and headaches.  Endo/Heme/Allergies:  Positive for environmental allergies. Does not bruise/bleed easily.       Objective:   Blood pressure 122/74, pulse 94, temperature 98.4 F (36.9 C), temperature source Temporal, resp. rate 14, height 5\' 8"  (1.727 m), weight 175 lb 6.4 oz (79.6 kg), SpO2 98 %. Body mass index is 26.67 kg/m.    Physical Exam Vitals reviewed.  Constitutional:      Appearance: He is well-developed.  HENT:     Head: Normocephalic and atraumatic.     Right Ear: Tympanic membrane, ear canal and external ear normal.     Left Ear: Tympanic membrane, ear canal and external ear normal.     Nose: No nasal deformity, septal deviation, mucosal edema or rhinorrhea.     Right Turbinates: Not enlarged or swollen.     Left Turbinates: Not enlarged or swollen.     Right Sinus: No maxillary sinus tenderness or frontal sinus tenderness.     Left Sinus: No maxillary sinus tenderness or frontal sinus tenderness.     Mouth/Throat:     Mouth: Mucous membranes are not pale and not dry.     Pharynx: Uvula midline.  Eyes:     General: Lids are normal. No allergic shiner.       Right eye: No discharge.        Left eye: No discharge.     Conjunctiva/sclera: Conjunctivae normal.     Right eye: Right conjunctiva is not injected. No chemosis.    Left eye: Left conjunctiva is not  injected. No chemosis.    Pupils: Pupils are equal, round, and reactive to light.  Cardiovascular:     Rate and Rhythm: Normal rate and regular rhythm.     Heart sounds: Normal heart sounds.  Pulmonary:     Effort: Pulmonary effort is normal. No tachypnea, accessory muscle usage or respiratory distress.     Breath sounds: Normal breath sounds. No wheezing, rhonchi or rales.  Chest:     Chest wall: No tenderness.  Lymphadenopathy:     Cervical: No cervical adenopathy.  Skin:    General: Skin is warm.     Capillary Refill: Capillary refill takes less than 2 seconds.     Coloration: Skin is not pale.     Findings: No abrasion, erythema, petechiae or rash. Rash is not  papular, urticarial or vesicular.     Comments: Eczematous lesions are much worse today on his bilateral lower legs and ankles. He has some isolated lesions on his arms as well.   Neurological:     Mental Status: He is alert.  Psychiatric:        Behavior: Behavior is cooperative.      Diagnostic studies:    Spirometry: results normal (FEV1: 4.26/102%, FVC: 4.81/94%, FEV1/FVC: 89%).    Spirometry consistent with normal pattern.  Allergy Studies: none        Malachi Bonds, MD  [facility]

## 2022-02-08 NOTE — Patient Instructions (Addendum)
1. Moderate persistent asthma, uncomplicated - Lung testing looked fairly good today. - Continue Trelegy one puff once daily (samples provided). - We are going to try to get this approved through your insurance.   2. Seasonal allergic rhinitis due to pollen - The cetirizine 10mg  daily for the itching will help with your allergy symptoms.   3. Eczema  - Continue with triamcinolone ointment twice daily as needed.  - Add on clobetasol ointment twice daily (max two weeks at a time). - DO NOT use the clobetasol on your face.  - Continue with moisturizing twice daily. - Continue with cetirizine 10mg  daily every day to help with itch control.  - Consider Dupixent for long term control of itching/eczema.   4. Return in about 6 months (around 08/10/2022).    Please inform of any Emergency Department visits, hospitalizations, or changes in symptoms. Call 08/12/2022 before going to the ED for breathing or allergy symptoms since we might be able to fit you in for a sick visit. Feel free to contact us anytime with any questions, problems, or concerns.  It was a pleasure to see you again today!  Websites that have reliable patient information: 1. American Academy of Asthma, Allergy, and Immunology: www.aaaai.org 2. Food Allergy Research and Education (FARE): foodallergy.org 3. Mothers of Asthmatics: http://www.asthmacommunitynetwork.org 4. American College of Allergy, Asthma, and Immunology: www.acaai.org   COVID-19 Vaccine Information can be found at: [place] For questions related to vaccine distribution or appointments, please email [email or web address] or call [phone].   We realize that you might be concerned about having an allergic reaction to the COVID19 vaccines. To help with that concern, WE ARE OFFERING THE COVID19 VACCINES IN OUR OFFICE! Ask the front desk for dates!     "Like" [email or web address] on Facebook and Instagram for our  latest updates!      A healthy democracy works best when [phone] participate! Make sure you are registered to vote! If you have moved or changed any of your contact information, you will need to get this updated before voting!  In some cases, you MAY be able to register to vote online: [place]      ECZEMA SKIN CARE REGIMEN:  Bathed and soak for 10 minutes in warm water once today. Pat dry.  Immediately apply the below creams:  To healthy skin apply Aquaphor, Eucerin, Vanicream, Cerave, or Vaseline jelly twice a day.      Use soaps and shampoos that are unscented and have the fewest amounts of additives. Some good examples include:

## 2022-03-08 ENCOUNTER — Ambulatory Visit: Payer: 59 | Admitting: Podiatry

## 2022-03-08 DIAGNOSIS — L600 Ingrowing nail: Secondary | ICD-10-CM | POA: Diagnosis not present

## 2022-03-08 NOTE — Progress Notes (Signed)
  Subjective:  Patient ID: Rebecca Clay, adult    DOB: 06-29-1990,  MRN: [id number]  Chief Complaint  Patient presents with   Ingrown Toenail    BIL ingrown    32 y.o. adult presents with concern for pain in the right hallux.  He says he has a possible ingrown nail there.  Denies any drainage but has had pain with any pressure on the area.  Says is really just 1 side of the nail that has been bothering him on the right side he does not have any pain in the left.  Past Medical History:  Diagnosis Date   Allergy    Anxiety    Asthma    Depression    Mental disorder     Allergies  Allergen Reactions   Bee Pollen Cough   Glycerin     Other Reaction(s): Not available, Unknown   Peanut-Containing Drug Products     Pt avoids all nuts   Pollen Extract Cough    ROS: Negative except as per HPI above  Objective:  General: AAO x3, NAD  Dermatological: Incurvation is present along the medial nail border of the right great toe. There is localized edema without any erythema or increase in warmth around the nail border. There is no drainage or pus. There is no ascending cellulitis. No malodor. No open lesions or pre-ulcerative lesions.    Vascular:  Dorsalis Pedis artery and Posterior Tibial artery pedal pulses are 2/4 bilateral.  Capillary fill time < 3 sec to all digits.   Neruologic: Grossly intact via light touch bilateral. Protective threshold intact to all sites bilateral.   Musculoskeletal: No gross boney pedal deformities bilateral. No pain, crepitus, or limitation noted with foot and ankle range of motion bilateral. Muscular strength 5/5 in all groups tested bilateral.  Gait: Unassisted, Nonantalgic.   No images are attached to the encounter.   Assessment:   1. Ingrown nail of great toe of right foot      Plan:  Patient was evaluated and treated and all questions answered.   Ingrown Nail, right -Patient elects to proceed with minor surgery to remove ingrown toenail  today. Consent reviewed and signed by patient. -Ingrown nail excised. See procedure note. -Educated on post-procedure care including soaking. Written instructions provided and reviewed. -Patient to follow up in 2 weeks for nail check.  Procedure: Excision of Ingrown Toenail Location: Right 1st toe medial nail borders. Anesthesia: Lidocaine 1% plain; 1.5 mL and Marcaine 0.5% plain; 1.5 mL, digital block. Skin Prep: Betadine. Dressing: Silvadene; telfa; dry, sterile, compression dressing. Technique: Following skin prep, the toe was exsanguinated and a tourniquet was secured at the base of the toe. The affected nail border was freed, split with a nail splitter, and excised. Chemical matrixectomy was then performed with phenol and irrigated out with alcohol. The tourniquet was then removed and sterile dressing applied. Disposition: Patient tolerated procedure well. Patient to return in 2 weeks for follow-up.    Return in about 2 weeks (around 03/22/2022) for R hallux medial ingrown nail P&A.          Everitt Amber, DPM [facility]

## 2022-03-08 NOTE — Patient Instructions (Signed)

## 2022-03-18 IMAGING — US US SOFT TISSUE HEAD/NECK
1 series · 14 of 24 positions shown · non-contrast
Comparison: None.

CLINICAL DATA: 29-year-old female with a history of cervical
lymphadenopathy

EXAM:
ULTRASOUND OF HEAD/NECK SOFT TISSUES
TECHNIQUE: Ultrasound examination of the head and neck soft tissues was
performed in the area of clinical concern.

[Series 1: us soft tissue head/neck · 0.04mm/px · 14 of 24 slices shown]
[im 1/24]
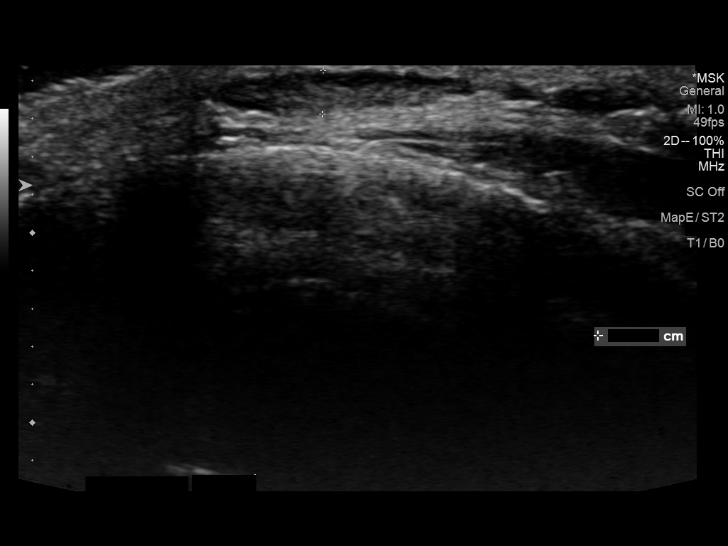
[im 3/24]
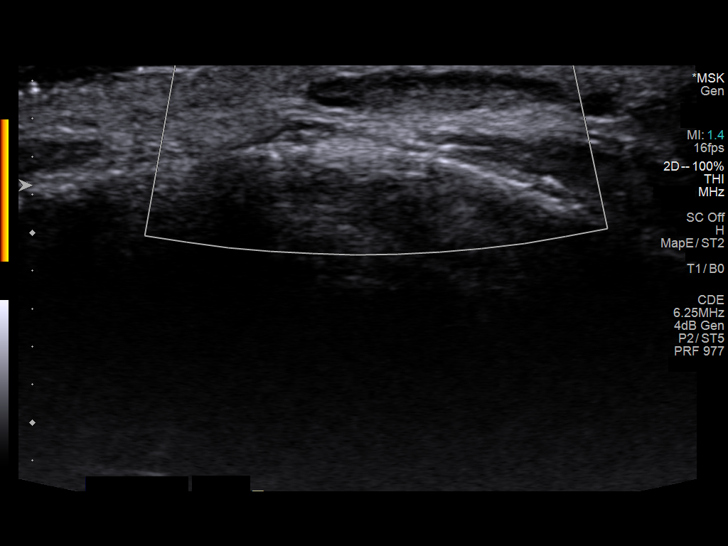
[im 5/24]
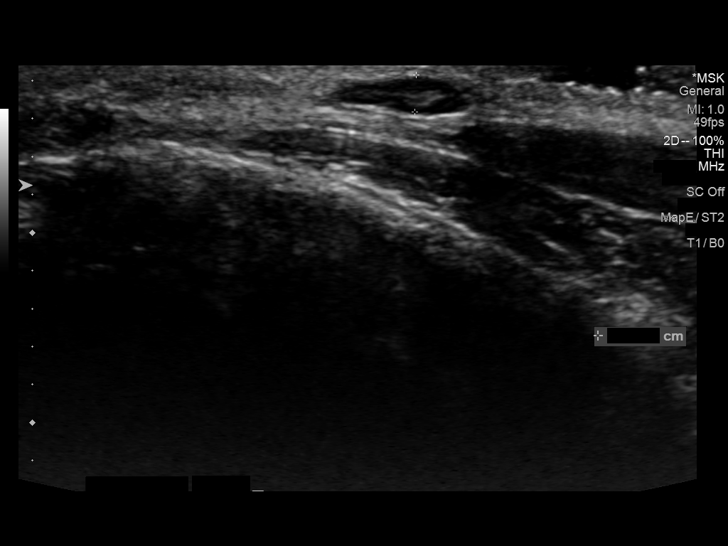
[im 7/24]
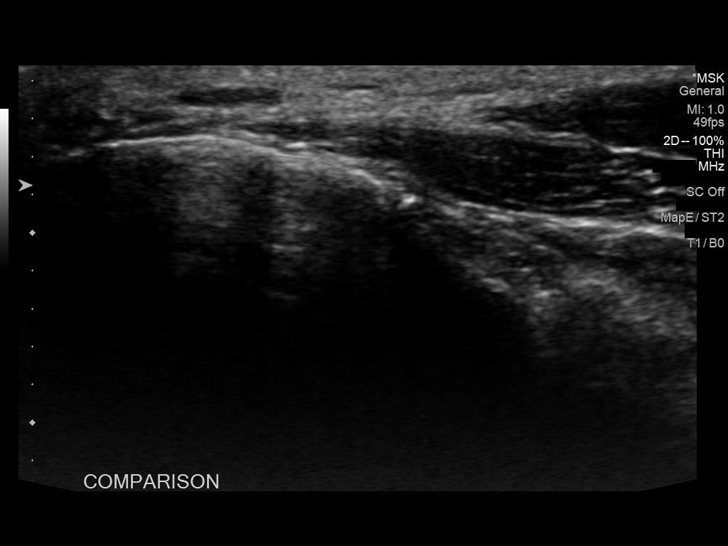
[im 8/24]
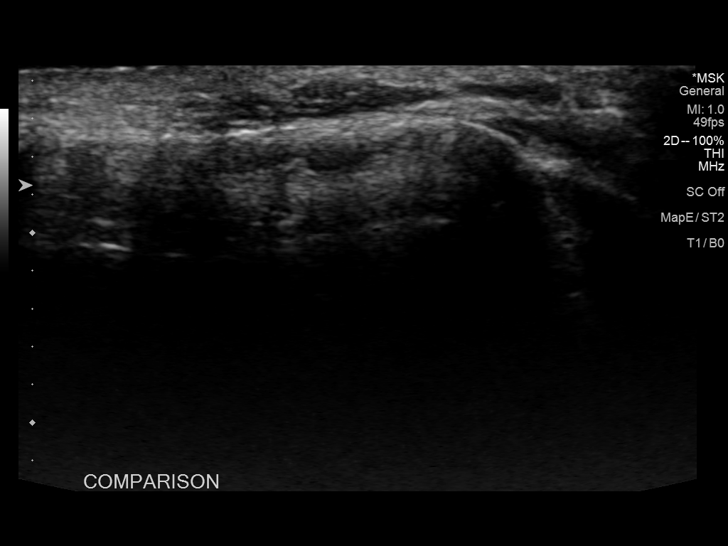
[im 10/24]
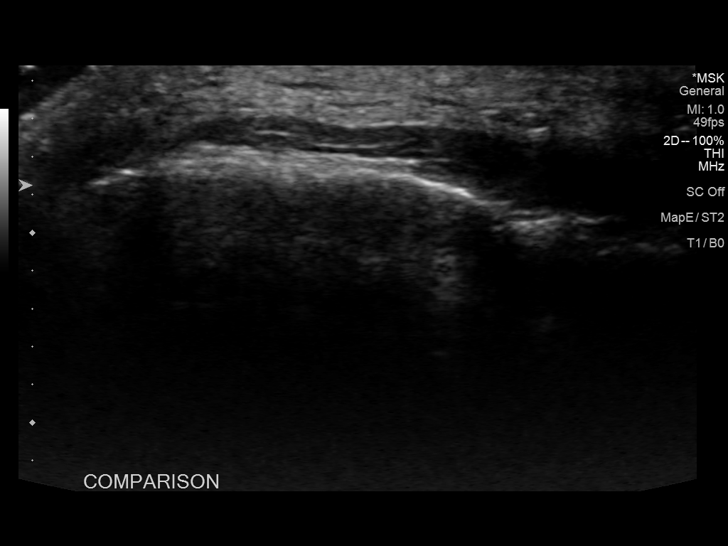
[im 12/24]
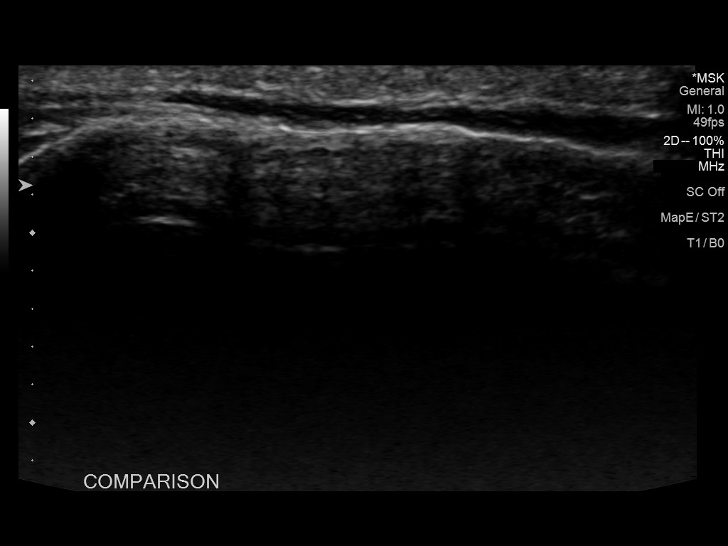
[im 13/24]
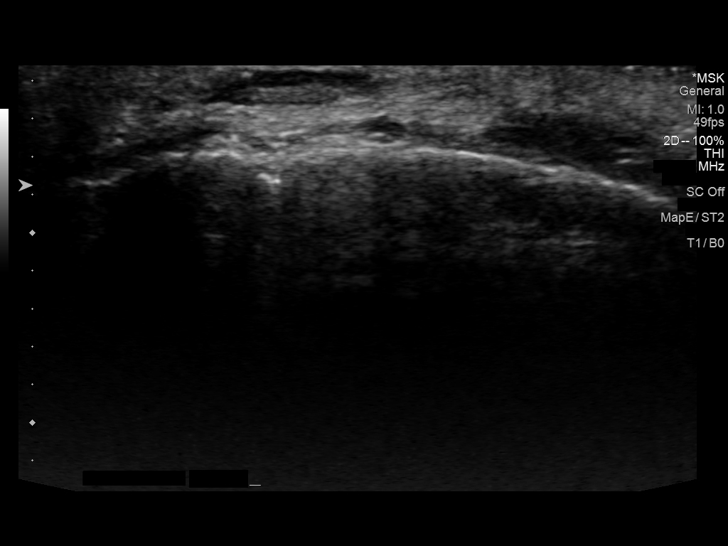
[im 15/24]
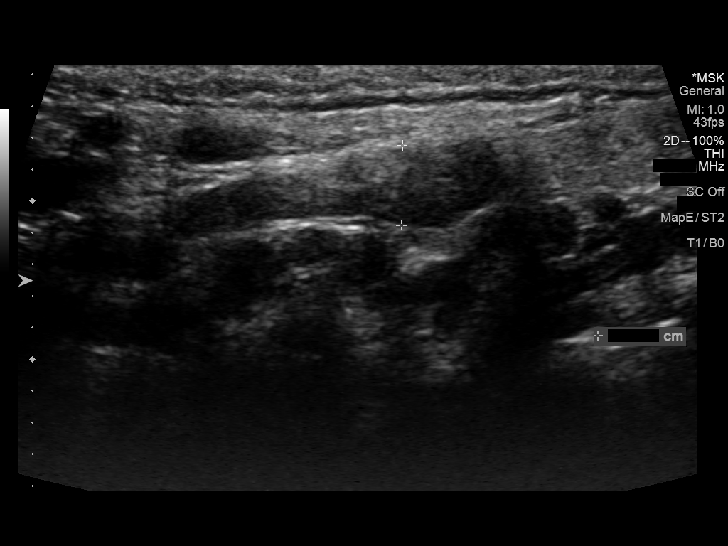
[im 17/24]
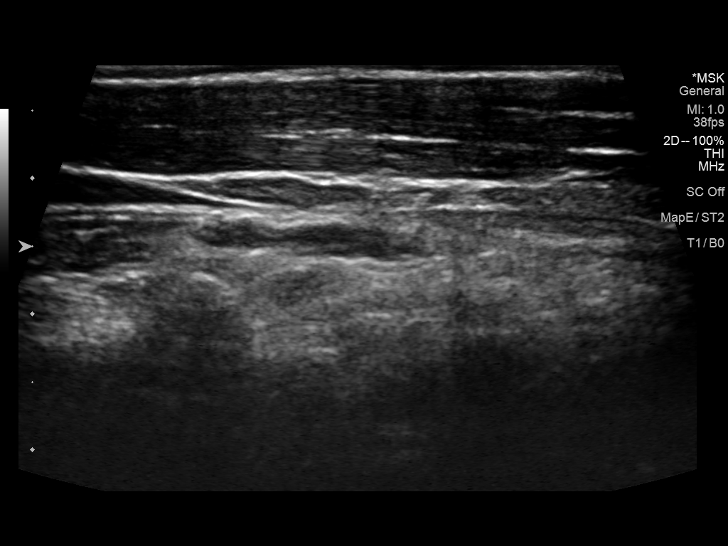
[im 19/24]
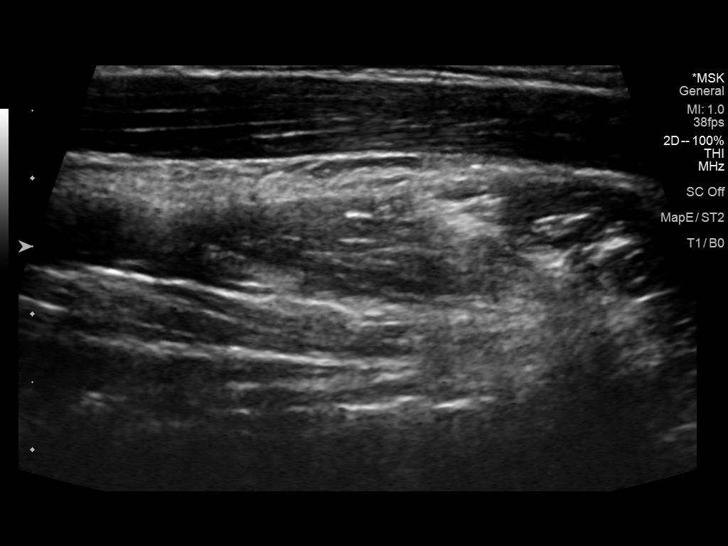
[im 20/24]
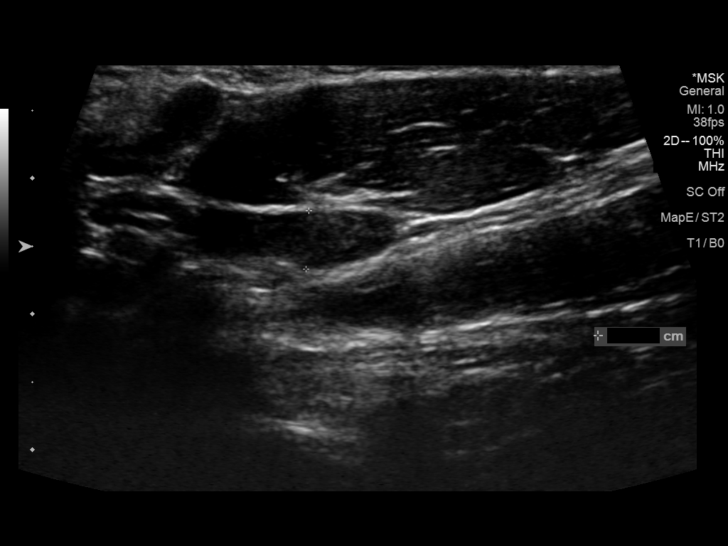
[im 22/24]
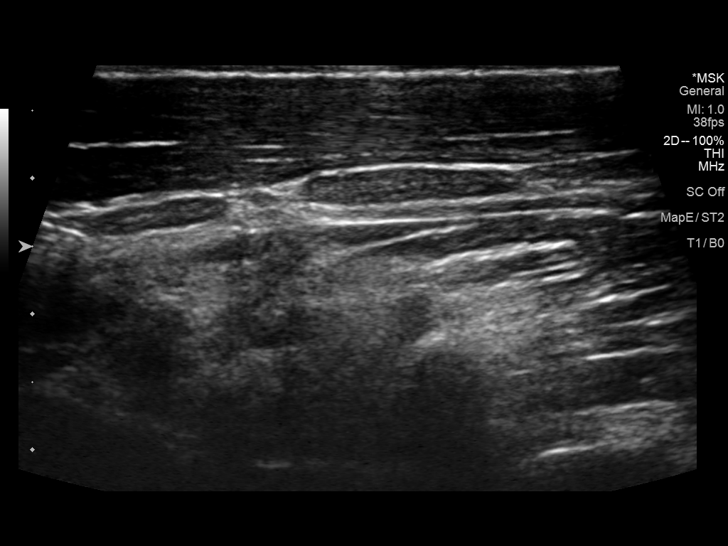
[im 24/24]
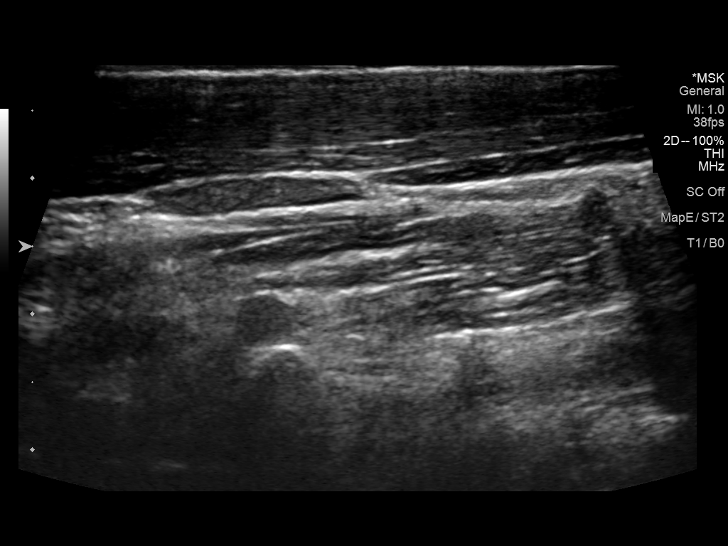

[14 of 24 positions shown; findings below may reference images not displayed]

FINDINGS: Grayscale and color duplex performed in the region of clinical
concern.

No focal fluid.  No soft tissue lesion.

No adenopathy. Small typical appearing lymph nodes in the region of
clinical concern.
IMPRESSION: Small typical appearing lymph nodes in the region of clinical
concern, nonspecific though potentially reactive

## 2022-03-22 ENCOUNTER — Ambulatory Visit: Payer: 59 | Admitting: *Deleted

## 2022-03-22 DIAGNOSIS — L600 Ingrowing nail: Secondary | ICD-10-CM

## 2022-03-22 DIAGNOSIS — Z9889 Other specified postprocedural states: Secondary | ICD-10-CM

## 2022-03-22 NOTE — Progress Notes (Signed)
Patient presents today for nail check of the hallux right - medial border. Ingrown toenail procedure was performed on 03/08/2022 by Dr. Loel Lofty.  Patient states that its been slightly tender. He has stopped soaking, but continues to cover with neosporin and bandaid.   Well healing toe s/p matrixectomy hallux right. There is no drainage or signs of infection. Advised to cover with neosporin and bandaid for the next week and leave uncovered at night. Once he has a dry scab, he can discontinue all after care.  Patient will follow up as needed.

## 2022-08-09 ENCOUNTER — Ambulatory Visit: Payer: 59 | Admitting: Allergy & Immunology

## 2022-08-09 ENCOUNTER — Other Ambulatory Visit: Payer: Self-pay

## 2022-08-09 ENCOUNTER — Encounter: Payer: Self-pay | Admitting: Allergy & Immunology

## 2022-08-09 DIAGNOSIS — J301 Allergic rhinitis due to pollen: Secondary | ICD-10-CM | POA: Diagnosis not present

## 2022-08-09 DIAGNOSIS — J4540 Moderate persistent asthma, uncomplicated: Secondary | ICD-10-CM

## 2022-08-09 DIAGNOSIS — L2089 Other atopic dermatitis: Secondary | ICD-10-CM

## 2022-08-09 MED ORDER — PIMECROLIMUS 1 % EX CREA: TOPICAL_CREAM | Freq: Two times a day (BID) | CUTANEOUS | 5 refills | Status: DC

## 2022-08-09 NOTE — Patient Instructions (Addendum)
1. Moderate persistent asthma, uncomplicated - Lung testing not done today. - We will get it next time instead.  - Continue Trelegy one puff once daily.   2. Seasonal allergic rhinitis due to pollen - Add on the cetirizine 10mg  daily (I get mine on [employer]).     - Continue with the montelukast 10mg  daily.  3. Eczema  - Continue with triamcinolone ointment twice daily as needed.  - Continue with clobetasol on your face.  - Continue with moisturizing twice daily. - Add on pimecrolimus twice daily as needed (SAFE to use over the entire body).  - Consent signed for Dupixent for long term control of itching/eczema.  - Call us in TWO WEEKS to let us know whether tacrolimus is working (this is needed to get Dupixent approved).   4. Return in about 6 weeks (around 09/20/2022).    Please inform us of any Emergency Department visits, hospitalizations, or changes in symptoms. Call us before going to the ED for breathing or allergy symptoms since we might be able to fit you in for a sick visit. Feel free to contact us anytime with any questions, problems, or concerns.  It was a pleasure to see you again today!  Websites that have reliable patient information: 1. American Academy of Asthma, Allergy, and Immunology: www.aaaai.org 2. Food Allergy Research and Education (FARE): foodallergy.org 3. Mothers of Asthmatics: http://www.asthmacommunitynetwork.org 4. American College of Allergy, Asthma, and Immunology: www.acaai.org   COVID-19 Vaccine Information can be found at: [email or web address] For questions related to vaccine distribution or appointments, please email [email or web address] or call [phone].   We realize that you might be concerned about having an allergic reaction to the COVID19 vaccines. To help with that concern, WE ARE OFFERING THE COVID19 VACCINES IN OUR OFFICE! Ask the front desk for dates!     "Like" [place] on  Facebook and Instagram for our latest updates!      A healthy democracy works best when [employer] participate! Make sure you are registered to vote! If you have moved or changed any of your contact information, you will need to get this updated before voting!  In some cases, you MAY be able to register to vote online: [email or web address]

## 2022-08-09 NOTE — Progress Notes (Signed)
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 Moderate persistent asthma, uncomplicated - Lung testing not done today. - We will get it next time instead.  - Continue Trelegy one puff once daily.   2. Seasonal allergic rhinitis due to pollen - Add on the cetirizine 10mg  daily (I get mine on [employer]).  - Continue with the montelukast 10mg  daily.  3. Eczema  - Continue with triamcinolone ointment twice daily as needed.  - Continue with clobetasol on your face.  - Continue with moisturizing twice daily. - Add on pimecrolimus twice daily as needed (SAFE to use over the entire body).  - Consent signed for Dupixent for long term control of itching/eczema.  - Call us in TWO WEEKS to let us know whether tacrolimus is working (this is needed to get Dupixent approved).   4. Return in about 6 weeks (around 09/20/2022).     Subjective:   Rebecca Clay is a 32 y.o. adult presenting today for follow up of  Chief Complaint  Patient presents with   Asthma    No issues    Allergic Rhinitis     Some itchiness - feet, legs, and arms     Rebecca Clay has a history of the following: Patient Active Problem List   Diagnosis Date Noted   Asthma 07/03/2019   Allergic rhinitis 07/03/2019   GERD (gastroesophageal reflux disease) 07/03/2019   Dyspnea 08/11/2018   Chronic cough 08/11/2018   Female-to-female transgender person 11/06/2017   High risk medication use 11/06/2017   Long term (current) use of systemic steroids 11/06/2017   MDD (major depressive disorder), recurrent severe, without psychosis (HCC) 04/09/2013    Suicidal ideations 04/08/2013   Overdose 04/08/2013    History obtained from: chart review and patient.  Rebecca Clay is a 32 y.o. adult presenting for a follow up visit. He was last seen in December 2023.  At that time, his lung testing looked fairly good.  We continue with Trelegy 1 puff once daily.  For his seasonal allergic rhinitis, we continue with Zyrtec 10 mg daily.  Eczema was not controlled with triamcinolone.  We did add on clobetasol to use for a small amount of time.  We continue with the cetirizine and discussed the addition of Dupixent.   Since last visit, he has done very well. He looks pretty chill today.   Asthma/Respiratory Symptom History: He remains on the Trelegy one puff once daily.  This is working well to control his symptoms. Shams's asthma has been well controlled. He has not required rescue medication, experienced nocturnal awakenings due to lower respiratory symptoms, nor have activities of daily living been limited. He has required no Emergency Department or Urgent Care visits for his asthma. He has required zero courses of systemic steroids for asthma exacerbations since the last visit. ACT score today is 25, indicating excellent asthma symptom control.   Allergic Rhinitis Symptom History: Environmental allergies are not well controlled. He has been very itchy.  He has had a lot of postnasal drip.  He has not had any sinus infections requiring antibiotics.  He  has not been using the cetirizine because it has been too expensive.  He has not thought about getting it on [employer], but is open to that.  Skin Symptom History: He is having a lot of itching. He tried using the cream but it does not always help.  He has the clobetasol as well as the triamcinolone.  Reviewed the chart and he has not tried any Eucrisa, Elidel, or Protopic.  He is open to giving that a try before changing to the Dupixent.  He has multiple lesions on his feet as well as his ankles.  They are open and somewhat  oozy.   Otherwise, there have been no changes to his past medical history, surgical history, family history, or social history.    Review of Systems  Constitutional: Negative.  Negative for chills, fever, malaise/fatigue and weight loss.  HENT: Negative.  Negative for congestion, ear discharge and ear pain.   Eyes:  Negative for pain, discharge and redness.  Respiratory:  Negative for cough, sputum production, shortness of breath and wheezing.   Cardiovascular: Negative.  Negative for chest pain and palpitations.  Gastrointestinal:  Negative for abdominal pain, constipation, diarrhea, heartburn, nausea and vomiting.  Skin: Negative.  Negative for itching and rash.  Neurological:  Negative for dizziness and headaches.  Endo/Heme/Allergies:  Positive for environmental allergies. Does not bruise/bleed easily.       Objective:   There were no vitals taken for this visit. There is no height or weight on file to calculate BMI.    Physical Exam Vitals reviewed.  Constitutional:      Appearance: He is well-developed.     Comments: Smiling.  Pleasant.  HENT:     Head: Normocephalic and atraumatic.     Right Ear: Tympanic membrane, ear canal and external ear normal.     Left Ear: Tympanic membrane, ear canal and external ear normal.     Nose: No nasal deformity, septal deviation, mucosal edema or rhinorrhea.     Right Turbinates: Enlarged and swollen.     Left Turbinates: Enlarged and swollen.     Right Sinus: No maxillary sinus tenderness or frontal sinus tenderness.     Left Sinus: No maxillary sinus tenderness or frontal sinus tenderness.     Mouth/Throat:     Mouth: Mucous membranes are not pale and not dry.     Pharynx: Uvula midline.  Eyes:     General: Lids are normal. No allergic shiner.       Right eye: No discharge.        Left eye: No discharge.     Conjunctiva/sclera: Conjunctivae normal.     Right eye: Right conjunctiva is not injected. No chemosis.    Left eye:  Left conjunctiva is not injected. No chemosis.    Pupils: Pupils are equal, round, and reactive to light.  Cardiovascular:     Rate and Rhythm: Normal rate and regular rhythm.     Heart sounds: Normal heart sounds.  Pulmonary:     Effort: Pulmonary effort is normal. No tachypnea, accessory muscle usage or respiratory distress.     Breath sounds: Normal breath sounds. No wheezing, rhonchi or rales.  Chest:     Chest wall: No tenderness.  Lymphadenopathy:     Cervical: No cervical adenopathy.  Skin:    General: Skin is warm.     Capillary Refill: Capillary refill takes less than 2 seconds.     Coloration: Skin is not pale.  Findings: No abrasion, erythema, petechiae or rash. Rash is not papular, urticarial or vesicular.     Comments: Eczematous lesions are much worse today on his bilateral lower legs and ankles. He has some isolated lesions on his arms as well. There are some open lesions on the top of his feet that are somewhat oozy.   Neurological:     Mental Status: He is alert.  Psychiatric:        Behavior: Behavior is cooperative.      Diagnostic studies: none       Malachi Bonds, MD  [facility]

## 2022-08-11 ENCOUNTER — Encounter: Payer: Self-pay | Admitting: Allergy & Immunology

## 2022-08-24 ENCOUNTER — Telehealth: Payer: Self-pay | Admitting: Allergy & Immunology

## 2022-08-24 NOTE — Telephone Encounter (Signed)
Patient states Dr.Gallagher told him to let him know if the pimecrolimus cream is working and if not he would do something else, pt states the cream is not working.

## 2022-08-28 NOTE — Telephone Encounter (Signed)
Pt informed of [place] moving forward with dupixent

## 2022-08-28 NOTE — Telephone Encounter (Signed)
Great - that should help get the Dupixent approved.   I would restart the clobetasol twice daily for two weeks. Hopefully the Dupixent that we started at the last visit is helping a bit.   Rebecca Clay  n

## 2022-08-28 NOTE — Telephone Encounter (Signed)
 Lm for pt to call us back about this °

## 2022-08-28 NOTE — Telephone Encounter (Signed)
Patient called back to return the call that was made to him.

## 2022-09-04 NOTE — Telephone Encounter (Signed)
Called patient and advised approval, copay card and submit to [employer] for Dupixent. Patient wants to get same in clinic will reach out once delivery set to make appt for next dose

## 2022-09-05 NOTE — Telephone Encounter (Signed)
Great - thank you!   Joel Gallagher, MD [facility]  

## 2022-09-10 ENCOUNTER — Encounter (HOSPITAL_BASED_OUTPATIENT_CLINIC_OR_DEPARTMENT_OTHER): Payer: Self-pay

## 2022-09-10 ENCOUNTER — Other Ambulatory Visit: Payer: Self-pay

## 2022-09-10 DIAGNOSIS — N200 Calculus of kidney: Secondary | ICD-10-CM | POA: Insufficient documentation

## 2022-09-10 DIAGNOSIS — Z91010 Allergy to peanuts: Secondary | ICD-10-CM | POA: Diagnosis not present

## 2022-09-10 DIAGNOSIS — R109 Unspecified abdominal pain: Secondary | ICD-10-CM | POA: Diagnosis present

## 2022-09-10 DIAGNOSIS — J45909 Unspecified asthma, uncomplicated: Secondary | ICD-10-CM | POA: Diagnosis not present

## 2022-09-10 LAB — CBC
HCT: 47.2 % — ABNORMAL HIGH (ref 36.0–46.0)
Hemoglobin: 16.2 g/dL — ABNORMAL HIGH (ref 12.0–15.0)
MCH: 28.2 pg (ref 26.0–34.0)
MCHC: 34.3 g/dL (ref 30.0–36.0)
MCV: 82.1 fL (ref 80.0–100.0)
Platelets: 280 10*3/uL (ref 150–400)
RBC: 5.75 MIL/uL — ABNORMAL HIGH (ref 3.87–5.11)
RDW: 13.3 % (ref 11.5–15.5)
WBC: 10.6 10*3/uL — ABNORMAL HIGH (ref 4.0–10.5)
nRBC: 0 % (ref 0.0–0.2)

## 2022-09-10 LAB — BASIC METABOLIC PANEL
Anion gap: 11 (ref 5–15)
BUN: 9 mg/dL (ref 6–20)
CO2: 26 mmol/L (ref 22–32)
Calcium: 9.6 mg/dL (ref 8.9–10.3)
Chloride: 103 mmol/L (ref 98–111)
Creatinine, Ser: 0.88 mg/dL (ref 0.44–1.00)
GFR, Estimated: >60 mL/min (ref >60)
Glucose, Bld: 91 mg/dL (ref 70–99)
Potassium: 3.9 mmol/L (ref 3.5–5.1)
Sodium: 140 mmol/L (ref 135–145)

## 2022-09-10 MED ORDER — ONDANSETRON HCL 4 MG/2ML IJ SOLN
4.0000 mg | Freq: Once | INTRAMUSCULAR | Status: AC
  Administered 2022-09-10: 4 mg via INTRAVENOUS
  Filled 2022-09-10: qty 2

## 2022-09-10 MED ORDER — KETOROLAC TROMETHAMINE 30 MG/ML IJ SOLN
30.0000 mg | Freq: Once | INTRAMUSCULAR | Status: AC
  Administered 2022-09-10: 30 mg via INTRAVENOUS
  Filled 2022-09-10: qty 1

## 2022-09-10 NOTE — ED Triage Notes (Addendum)
POV from home, A&O x 4, GCS 15, bib wheelchair  C/o right sided flank pain with radiation to abd, urinary frequency, began today. hx of possible kidney stones. Denies NVD.

## 2022-09-11 ENCOUNTER — Emergency Department (HOSPITAL_BASED_OUTPATIENT_CLINIC_OR_DEPARTMENT_OTHER): Payer: 59

## 2022-09-11 ENCOUNTER — Emergency Department (HOSPITAL_BASED_OUTPATIENT_CLINIC_OR_DEPARTMENT_OTHER)
Admission: EM | Admit: 2022-09-11 | Discharge: 2022-09-11 | Disposition: A | Discharge status: Home / Self Care | Payer: 59 | Attending: Emergency Medicine | Admitting: Emergency Medicine

## 2022-09-11 DIAGNOSIS — N200 Calculus of kidney: Secondary | ICD-10-CM

## 2022-09-11 MED ORDER — SODIUM CHLORIDE 0.9 % IV BOLUS
1000.0000 mL | Freq: Once | INTRAVENOUS | Status: AC
  Administered 2022-09-11: 1000 mL via INTRAVENOUS

## 2022-09-11 MED ORDER — OXYCODONE-ACETAMINOPHEN 5-325 MG PO TABS: 1.0000 | ORAL_TABLET | Freq: Four times a day (QID) | ORAL | 0 refills | Status: AC | PRN

## 2022-09-11 MED ORDER — TAMSULOSIN HCL 0.4 MG PO CAPS: 0.4000 mg | ORAL_CAPSULE | Freq: Every day | ORAL | 0 refills | Status: AC

## 2022-09-11 MED ORDER — OXYCODONE-ACETAMINOPHEN 5-325 MG PO TABS
1.0000 | ORAL_TABLET | Freq: Once | ORAL | Status: AC
  Administered 2022-09-11: 1 via ORAL
  Filled 2022-09-11: qty 1

## 2022-09-11 MED ORDER — ONDANSETRON 4 MG PO TBDP: 4.0000 mg | ORAL_TABLET | Freq: Three times a day (TID) | ORAL | 0 refills | Status: AC | PRN

## 2022-09-11 MED ORDER — KETOROLAC TROMETHAMINE 30 MG/ML IJ SOLN
30.0000 mg | Freq: Once | INTRAMUSCULAR | Status: AC
  Administered 2022-09-11: 30 mg via INTRAVENOUS
  Filled 2022-09-11: qty 1

## 2022-09-11 NOTE — ED Provider Notes (Signed)
[facility] EMERGENCY DEPARTMENT AT [facility] Provider Note   CSN: [id number] Arrival date & time: 09/10/22  2234     History  Chief Complaint  Patient presents with   Flank Pain    Rebecca Clay is a 32 y.o. adult.  HPI     This is a 32 year old transgender female who presents with right-sided flank pain.  Patient reports onset of acute right-sided flank pain earlier today.  It does not radiate.  Endorses associated urinary frequency.  No hematuria.  No fevers.  He states he has had symptoms like this in the past and believes he has had kidney stones.  Did not take anything for the pain prior to arrival.  Home Medications Prior to Admission medications   Medication Sig Start Date End Date Taking? Authorizing Provider  ondansetron (ZOFRAN-ODT) 4 MG disintegrating tablet Take 1 tablet (4 mg total) by mouth every 8 (eight) hours as needed for nausea or vomiting. 09/11/22  Yes , Mayer Masker, MD  oxyCODONE-acetaminophen (PERCOCET/ROXICET) 5-325 MG tablet Take 1 tablet by mouth every 6 (six) hours as needed for severe pain. 09/11/22  Yes , Mayer Masker, MD  tamsulosin (FLOMAX) 0.4 MG CAPS capsule Take 1 capsule (0.4 mg total) by mouth daily. 09/11/22  Yes , Mayer Masker, MD  albuterol (VENTOLIN HFA) 108 (90 Base) MCG/ACT inhaler Inhale 2 puffs into the lungs as needed.    [provider]  albuterol (VENTOLIN HFA) 108 (90 Base) MCG/ACT inhaler Inhale 2 puffs into the lungs every 6 (six) hours as needed for wheezing or shortness of breath. 02/08/22   [name], MD  clobetasol ointment (TEMOVATE) 0.05 % Apply 1 Application topically 2 (two) times daily. Use for two weeks at a time max at a time. 02/08/22   [name], MD  ibuprofen (ADVIL,MOTRIN) 800 MG tablet Take 1 tablet (800 mg total) by mouth 3 (three) times daily. 01/02/17   Mackuen, Courteney Lyn, MD  ketoconazole (NIZORAL) 2 % shampoo SMARTSIG:Topical 2-3 Times Weekly 02/21/22    [provider]  methylphenidate 54 MG PO CR tablet Take 54 mg by mouth every morning.    [provider]  montelukast (SINGULAIR) 10 MG tablet Take 10 mg by mouth daily.    [provider]  omeprazole (PRILOSEC) 40 MG capsule Take 40 mg by mouth daily.    [provider]  pimecrolimus (ELIDEL) 1 % cream Apply topically 2 (two) times daily. 08/09/22   [name], MD  testosterone cypionate (DEPOTESTOSTERONE CYPIONATE) 200 MG/ML injection ADMINISTER 0.4 ML(80 MG) IN THE MUSCLE 1 TIME A WEEK 01/27/18   Sherren Mocha, MD  TRELEGY ELLIPTA 100-62.5-25 MCG/ACT AEPB Inhale 1 puff into the lungs daily. 02/08/22   [name], MD  triamcinolone ointment (KENALOG) 0.1 % Apply 1 Application topically 2 (two) times daily as needed. 02/08/22   [name], MD      Allergies    Bee pollen, Glycerin, Peanut-containing drug products, and Pollen extract    Review of Systems   Review of Systems  Constitutional:  Negative for fever.  Cardiovascular:  Negative for chest pain.  Gastrointestinal:  Negative for abdominal pain.  Genitourinary:  Positive for flank pain and frequency. Negative for dysuria and hematuria.  All other systems reviewed and are negative.   Physical Exam Updated Vital Signs BP (!) 134/92   Pulse 88   Temp 98.4 F (36.9 C)   Resp 18   Ht 1.727 m (5\' 8" )  Wt 78 kg   SpO2 100%   BMI 26.15 kg/m  Physical Exam Vitals and nursing note reviewed.  Constitutional:      Appearance: He is well-developed. He is not ill-appearing.  HENT:     Head: Normocephalic and atraumatic.  Eyes:     Pupils: Pupils are equal, round, and reactive to light.  Cardiovascular:     Rate and Rhythm: Normal rate and regular rhythm.     Heart sounds: Normal heart sounds. No murmur heard. Pulmonary:     Effort: Pulmonary effort is normal. No respiratory distress.     Breath sounds: Normal breath sounds. No wheezing.  Abdominal:      General: Bowel sounds are normal.     Palpations: Abdomen is soft.     Tenderness: There is no abdominal tenderness. There is no right CVA tenderness, left CVA tenderness or rebound.  Musculoskeletal:     Cervical back: Neck supple.  Lymphadenopathy:     Cervical: No cervical adenopathy.  Skin:    General: Skin is warm and dry.  Neurological:     Mental Status: He is alert and oriented to person, place, and time.  Psychiatric:        Mood and Affect: Mood normal.     ED Results / Procedures / Treatments   Labs (all labs ordered are listed, but only abnormal results are displayed) Labs Reviewed  URINALYSIS, ROUTINE W REFLEX MICROSCOPIC - Abnormal; Notable for the following components:      Result Value   APPearance HAZY (*)    Hgb urine dipstick LARGE (*)    Protein, ur 30 (*)    Leukocytes,Ua TRACE (*)    Bacteria, UA FEW (*)    All other components within normal limits  CBC - Abnormal; Notable for the following components:   WBC 10.6 (*)    RBC 5.75 (*)    Hemoglobin 16.2 (*)    HCT 47.2 (*)    All other components within normal limits  PREGNANCY, URINE  BASIC METABOLIC PANEL    EKG None  Radiology CT RENAL STONE STUDY  Result Date: 09/11/2022 CLINICAL DATA:  Right-sided flank pain with radiation to abdomen. Urinary frequency. EXAM: CT ABDOMEN AND PELVIS WITHOUT CONTRAST TECHNIQUE: Multidetector CT imaging of the abdomen and pelvis was performed following the standard protocol without IV contrast. RADIATION DOSE REDUCTION: This exam was performed according to the departmental dose-optimization program which includes automated exposure control, adjustment of the mA and/or kV according to patient size and/or use of iterative reconstruction technique. COMPARISON:  None Available. FINDINGS: Lower chest: No acute abnormality. Hepatobiliary: No focal liver abnormality is seen. No gallstones, gallbladder wall thickening, or biliary dilatation. Pancreas: Unremarkable. No  pancreatic ductal dilatation or surrounding inflammatory changes. Spleen: Normal in size without focal abnormality. Adrenals/Urinary Tract: The adrenal glands are within normal limits. Nonobstructive renal calculi are present bilaterally. There is mild to moderate hydroureteronephrosis on the right with 2 stones in the distal right ureter measuring 5 mm and 9 x 10 mm calculus in the distal right ureter at the UVJ, best seen on coronal images. No obstructive uropathy on the left. The bladder is unremarkable. Stomach/Bowel: Stomach is within normal limits. Appendix appears normal. No evidence of bowel wall thickening, distention, or inflammatory changes. No free air or pneumatosis. Vascular/Lymphatic: No significant vascular findings are present. No enlarged abdominal or pelvic lymph nodes. Reproductive: Uterus and bilateral adnexa are unremarkable. Other: No abdominopelvic ascites. Musculoskeletal: No acute osseous abnormality. IMPRESSION: 1. Mild-to-moderate  obstructive uropathy on the right with 2 stones in the distal right ureter at the UVJ measuring 9 x 10 mm and 5 mm. 2. Bilateral nephrolithiasis. Electronically Signed   By: Thornell Sartorius M.D.   On: 09/11/2022 00:45    Procedures Procedures    Medications Ordered in ED Medications  oxyCODONE-acetaminophen (PERCOCET/ROXICET) 5-325 MG per tablet 1 tablet (has no administration in time range)  ketorolac (TORADOL) 30 MG/ML injection 30 mg (30 mg Intravenous Given 09/10/22 2247)  ondansetron (ZOFRAN) injection 4 mg (4 mg Intravenous Given 09/10/22 2247)  sodium chloride 0.9 % bolus 1,000 mL (0 mLs Intravenous Stopped 09/11/22 0254)  ketorolac (TORADOL) 30 MG/ML injection 30 mg (30 mg Intravenous Given 09/11/22 0254)    ED Course/ Medical Decision Making/ A&P                             Medical Decision Making Amount and/or Complexity of Data Reviewed Labs: ordered. Radiology: ordered.  Risk Prescription drug management.   This patient presents  to the ED for concern of flank pain, this involves an extensive number of treatment options, and is a complaint that carries with it a high risk of complications and morbidity.  I considered the following differential and admission for this acute, potentially life threatening condition.  The differential diagnosis includes kidney stone, UTI, cholecystitis, appendicitis  MDM:    This is a 32 year old transgender female who presents with right flank pain.  History and physical exam is most consistent with likely kidney stone.  Patient given fluids, Toradol, Zofran.  Labs obtained and reviewed.  No evidence of UTI.  Kidney function is preserved.  CT scan does confirm 2 right-sided kidney stones.  1 is approximately 5 mm, the other is approximately 9 mm.  Discussed with the patient that these may pass on their own; however, the larger stone may ultimately require stenting.  His pain was relatively controlled while in the emergency department.  Will discharge with pain medication, Flomax, Zofran.  Patient was given urology follow-up.  (Labs, imaging, consults)  Labs: I Ordered, and personally interpreted labs.  The pertinent results include: CBC, BMP, urinalysis, urine pregnancy  Imaging Studies ordered: I ordered imaging studies including CT stone study I independently visualized and interpreted imaging. I agree with the radiologist interpretation  Additional history obtained from family at bedside.  External records from outside source obtained and reviewed including prior evaluations  Cardiac Monitoring: The patient was maintained on a cardiac monitor.  If on the cardiac monitor, I personally viewed and interpreted the cardiac monitored which showed an underlying rhythm of: Sinus rhythm  Reevaluation: After the interventions noted above, I reevaluated the patient and found that they have :improved  Social Determinants of Health:  lives independently  Disposition: Discharge  Co morbidities  that complicate the patient evaluation  Past Medical History:  Diagnosis Date   Allergy    Anxiety    Asthma    Depression    Mental disorder      Medicines Meds ordered this encounter  Medications   ketorolac (TORADOL) 30 MG/ML injection 30 mg   ondansetron (ZOFRAN) injection 4 mg   sodium chloride 0.9 % bolus 1,000 mL   ketorolac (TORADOL) 30 MG/ML injection 30 mg   oxyCODONE-acetaminophen (PERCOCET/ROXICET) 5-325 MG per tablet 1 tablet   oxyCODONE-acetaminophen (PERCOCET/ROXICET) 5-325 MG tablet    Sig: Take 1 tablet by mouth every 6 (six) hours as needed for severe pain.  Dispense:  15 tablet    Refill:  0   tamsulosin (FLOMAX) 0.4 MG CAPS capsule    Sig: Take 1 capsule (0.4 mg total) by mouth daily.    Dispense:  30 capsule    Refill:  0   ondansetron (ZOFRAN-ODT) 4 MG disintegrating tablet    Sig: Take 1 tablet (4 mg total) by mouth every 8 (eight) hours as needed for nausea or vomiting.    Dispense:  20 tablet    Refill:  0    I have reviewed the patients home medicines and have made adjustments as needed  Problem List / ED Course: Problem List Items Addressed This Visit   None Visit Diagnoses     Kidney stone    -  Primary   Relevant Medications   oxyCODONE-acetaminophen (PERCOCET/ROXICET) 5-325 MG per tablet 1 tablet (Start on 09/11/2022  3:15 AM)   oxyCODONE-acetaminophen (PERCOCET/ROXICET) 5-325 MG tablet                   Final Clinical Impression(s) / ED Diagnoses Final diagnoses:  Kidney stone    Rx / DC Orders ED Discharge Orders          Ordered    oxyCODONE-acetaminophen (PERCOCET/ROXICET) 5-325 MG tablet  Every 6 hours PRN        09/11/22 0305    tamsulosin (FLOMAX) 0.4 MG CAPS capsule  Daily        09/11/22 0305    ondansetron (ZOFRAN-ODT) 4 MG disintegrating tablet  Every 8 hours PRN        09/11/22 0305              Shon Baton, MD 09/11/22 [phone]

## 2022-09-11 NOTE — Discharge Instructions (Signed)
You were seen today and found to have kidney stones.  You have 2 kidney stones on the right.  1 is approximately 5 mm.  The other 1 is approximately 9 mm.  Both of the stones may pass on their own however the larger stone may ultimately need intervention by urology.  If you have worsening pain or any new or worsening symptoms including fever, you should be evaluated by urology.  See provided urology follow-up.  He should return for any new or worsening symptoms.

## 2022-09-11 NOTE — ED Notes (Signed)
Pt providing urine sample.

## 2022-09-17 ENCOUNTER — Other Ambulatory Visit: Payer: Self-pay | Admitting: Urology

## 2022-09-19 ENCOUNTER — Ambulatory Visit (INDEPENDENT_AMBULATORY_CARE_PROVIDER_SITE_OTHER): Payer: 59

## 2022-09-19 DIAGNOSIS — J4540 Moderate persistent asthma, uncomplicated: Secondary | ICD-10-CM

## 2022-09-19 MED ORDER — DUPILUMAB 300 MG/2ML ~~LOC~~ SOSY
300.0000 mg | PREFILLED_SYRINGE | SUBCUTANEOUS | Status: AC
  Administered 2022-09-19 – 2022-12-25 (×7): 300 mg via SUBCUTANEOUS

## 2022-09-20 ENCOUNTER — Encounter (HOSPITAL_BASED_OUTPATIENT_CLINIC_OR_DEPARTMENT_OTHER): Payer: Self-pay | Admitting: Urology

## 2022-09-20 NOTE — Progress Notes (Signed)
09/20/2022 1:22 PM Pre procedure call completed. Pt. Updated on date and time of arrival 09/24/22 0800. Times for NPO MN and clear liquid consumption until MN reviewed. Pt. Allergies, medical hx and medication list reviewed. Medications ok to take day of surgery (none) and those to hold prior to surgery reviewed. Ride secured for day of surgery (pt. Wife). Questions and concerns addressed by patient. Pt. Verbalized understanding of all instructions.   , Blanchard Kelch

## 2022-09-24 ENCOUNTER — Ambulatory Visit (HOSPITAL_BASED_OUTPATIENT_CLINIC_OR_DEPARTMENT_OTHER): Admission: RE | Admit: 2022-09-24 | Payer: 59 | Source: Ambulatory Visit | Admitting: Urology

## 2022-09-24 HISTORY — DX: Personal history of urinary calculi: Z87.442

## 2022-09-24 SURGERY — LITHOTRIPSY, ESWL
Anesthesia: LOCAL | Laterality: Right

## 2022-10-04 ENCOUNTER — Ambulatory Visit (INDEPENDENT_AMBULATORY_CARE_PROVIDER_SITE_OTHER): Payer: 59

## 2022-10-04 ENCOUNTER — Ambulatory Visit: Payer: 59

## 2022-10-04 DIAGNOSIS — J4540 Moderate persistent asthma, uncomplicated: Secondary | ICD-10-CM

## 2022-10-05 ENCOUNTER — Other Ambulatory Visit (HOSPITAL_COMMUNITY): Payer: Self-pay

## 2022-10-05 ENCOUNTER — Telehealth: Payer: Self-pay

## 2022-10-05 NOTE — Telephone Encounter (Signed)
Pharmacy Patient Advocate Encounter   Received notification from CoverMyMeds that prior authorization for Trelegy Ellipta 100-62.5-25MCG/ACT aerosol powder is required/requested.   Insurance verification completed.   The patient is insured through [facility] .   Per test claim: PA required; PA submitted to [facility] via CoverMyMeds Key/confirmation #/EOC [id number] Status is pending

## 2022-10-06 ENCOUNTER — Other Ambulatory Visit (HOSPITAL_COMMUNITY): Payer: Self-pay

## 2022-10-08 ENCOUNTER — Ambulatory Visit (HOSPITAL_BASED_OUTPATIENT_CLINIC_OR_DEPARTMENT_OTHER): Admit: 2022-10-08 | Payer: 59 | Admitting: Urology

## 2022-10-08 ENCOUNTER — Encounter (HOSPITAL_BASED_OUTPATIENT_CLINIC_OR_DEPARTMENT_OTHER): Payer: Self-pay

## 2022-10-08 ENCOUNTER — Other Ambulatory Visit (HOSPITAL_COMMUNITY): Payer: Self-pay

## 2022-10-08 SURGERY — LITHOTRIPSY, ESWL
Anesthesia: LOCAL | Laterality: Right

## 2022-10-08 MED ORDER — TRELEGY ELLIPTA 100-62.5-25 MCG/ACT IN AEPB: 1.0000 | INHALATION_SPRAY | Freq: Every day | RESPIRATORY_TRACT | 5 refills | Status: DC

## 2022-10-08 NOTE — Telephone Encounter (Signed)
Pharmacy informed of approval.

## 2022-10-08 NOTE — Addendum Note (Signed)
Addended by: Berna Bue on: 10/08/2022 03:35 PM   Modules accepted: Orders

## 2022-10-08 NOTE — Telephone Encounter (Signed)
Pharmacy Patient Advocate Encounter  Received notification from [facility] that Prior Authorization for Trelegy Ellipta 100-62.5-25MCG/ACT aerosol powder has been APPROVED from 10-05-2022 to 10-04-2023. Ran test claim, Copay is $0.00. This test claim was processed through [facility]- copay amounts may vary at other pharmacies due to pharmacy/plan contracts, or as the patient moves through the different stages of their insurance plan.   PA #/Case ID/Reference #: [id number]

## 2022-10-16 ENCOUNTER — Ambulatory Visit: Payer: 59 | Admitting: Allergy & Immunology

## 2022-10-16 ENCOUNTER — Other Ambulatory Visit: Payer: Self-pay

## 2022-10-16 ENCOUNTER — Encounter: Payer: Self-pay | Admitting: Allergy & Immunology

## 2022-10-16 VITALS — BP 126/84 | HR 98 | Temp 98.9°F | Resp 18 | Ht 68.0 in

## 2022-10-16 DIAGNOSIS — L2089 Other atopic dermatitis: Secondary | ICD-10-CM | POA: Diagnosis not present

## 2022-10-16 DIAGNOSIS — J4540 Moderate persistent asthma, uncomplicated: Secondary | ICD-10-CM | POA: Diagnosis not present

## 2022-10-16 DIAGNOSIS — J301 Allergic rhinitis due to pollen: Secondary | ICD-10-CM | POA: Diagnosis not present

## 2022-10-16 NOTE — Progress Notes (Signed)
FOLLOW UP  Date of Service/Encounter:  10/16/22   Assessment:   Moderate persistent asthma, uncomplicated - with marked improvement on triple therapy inhaler   Seasonal allergic rhinitis due to pollen (trees)   Major depressive disorder - on duloxetine   Eczema - much better on Dupixent   Acne - seeing Dermatology in the near future   Plan/Recommendations:   1. Moderate persistent asthma, uncomplicated - Lung testing looked excellent today. - I think we are on a good track.  - Continue Trelegy one puff once daily.  - Continue with albuterol as needed.   2. Seasonal allergic rhinitis due to pollen - Continue with cetirizine 10mg  daily. - Continue with the montelukast 10mg  daily.  3. Eczema - well controlled with Dupixent every 2 weeks.  - Continue with the as needed use of the topical creams:  - Triamcinolone ointment twice daily as needed (NOT ON THE FACE) - Clobetasol ointment twice daily as needed (NOT ON THE FACE)  - Pimecrolimus twice daily as needed (SAFE to use over the entire body, including the face).   4. Return in about 6 months (around 04/15/2023).   Subjective:   Rebecca Clay is a 32 y.o. 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  Rebecca Clay is a 32 y.o. adult presenting for a follow up visit.  He was  last seen in June 2024.  At that time, his lung testing was not done.  We continue with Trelegy 1 puff once daily.  For his seasonal rhinitis, we added on cetirizine 10 mg daily and continue with the montelukast 10 mg daily.  For his eczema, we continue with triamcinolone twice daily as well as clobetasol.  We added on Elidel.  He did decide to start Dupixent for long-term control of his eczema.    Since the last visit, he is doing very well.  Asthma/Respiratory Symptom History: He remains on the Trelegy one puff once daily. He is not paying anything for his Trelegy. He is not using the rescue inhaler much at all. He has not needed it.  He has not been on prednisone.  He has not been to the emergency room.  He is not having any nighttime coughing or wheezing.  Overall, the Trelegy is doing very well.  Allergic Rhinitis Symptom History: Allergic rhinitis is controlled with the cetirizine and the montelukast.  This is enough to keep everything under good control.  He has not been on antibiotics.  Skin Symptom History: He is on the Dupixent.  He reports that there is much less itchiness than without the Dupixent. He thinks that the itching improved after a couple of shots.  He has some healing lesions on his lower extremities, but these are much better than last time.  He is not really using any of the topical steroids,  but he is open to trying this. He feels much better from a quality-of-life perspective.  Otherwise, there have been no changes to his past medical history, surgical history, family history, or social history.    Review of systems otherwise negative other than that mentioned in the HPI.    Objective:   Blood pressure 126/84, pulse 98, temperature 98.9 F (37.2 C), resp. rate 18, height 5\' 8"  (1.727 m), SpO2 99%. Body mass index is 26.15 kg/m.    Physical Exam Vitals reviewed.  Constitutional:      Appearance: He is well-developed.     Comments: Smiling.  Pleasant.  HENT:      Head: Normocephalic and atraumatic.     Right Ear: Tympanic membrane, ear canal and external ear normal.     Left Ear: Tympanic membrane, ear canal and external ear normal.     Nose: No nasal deformity, septal deviation, mucosal edema or rhinorrhea.     Right Turbinates: Enlarged and swollen.     Left Turbinates: Enlarged and swollen.     Right Sinus: No maxillary sinus tenderness or frontal sinus tenderness.     Left Sinus: No maxillary sinus tenderness or frontal sinus tenderness.     Mouth/Throat:     Mouth: Mucous membranes are not pale and not dry.     Pharynx: Uvula midline.  Eyes:     General: Lids are normal. No allergic shiner.       Right eye: No discharge.        Left eye: No discharge.     Conjunctiva/sclera: Conjunctivae normal.     Right eye: Right conjunctiva is not injected. No chemosis.    Left eye: Left conjunctiva is not injected. No chemosis.    Pupils: Pupils are equal, round, and reactive to light.  Cardiovascular:     Rate and Rhythm: Normal rate and regular rhythm.     Heart sounds: Normal heart sounds.  Pulmonary:     Effort: Pulmonary effort is normal. No tachypnea, accessory muscle usage or respiratory distress.     Breath sounds: Normal breath sounds. No wheezing, rhonchi or rales.  Chest:     Chest wall: No tenderness.  Lymphadenopathy:     Cervical: No cervical adenopathy.  Skin:    General: Skin is warm.     Capillary Refill: Capillary refill takes less than 2 seconds.     Coloration: Skin is not pale.     Findings: Rash present. No abrasion, erythema or petechiae. Rash is not papular, urticarial or vesicular.     Comments: He does have some eczematous lesions on his bilateral lower extremities that are healing.  They certainly look much better than the last time we saw him. No oozing or honey crusting noted. Minimal excoriations noted.   Neurological:     Mental Status: He is alert.  Psychiatric:        Behavior: Behavior is cooperative.       Diagnostic studies:    Spirometry: results normal (FEV1: 4.24/118%, FVC: 4.74/109%, FEV1/FVC: 89%).    Spirometry consistent with normal pattern.   Allergy Studies: none        Malachi Bonds, MD  [facility]

## 2022-10-16 NOTE — Patient Instructions (Addendum)
1. Moderate persistent asthma, uncomplicated - Lung testing looked excellent today. - I think we are on a good track.  - Continue Trelegy one puff once daily.  - Continue with albuterol as needed.   2. Seasonal allergic rhinitis due to pollen - Continue with cetirizine 10mg  daily. - Continue with the montelukast 10mg  daily.  3. Eczema - well controlled with Dupixent every 2 weeks.  - Continue with the as needed use of the topical creams:  - Triamcinolone ointment twice daily as needed (NOT ON THE FACE) - Clobetasol ointment twice daily as needed (NOT ON THE FACE)  - Pimecrolimus twice daily as needed (SAFE to use over the entire body, including the face).   4. Return in about 6 months (around 04/15/2023).    Please inform us of any Emergency Department visits, hospitalizations, or changes in symptoms. Call us before going to the ED for breathing or allergy symptoms since we might be able to fit you in for a sick visit. Feel free to contact us anytime with any questions, problems, or concerns.  It was a pleasure to see you again today!  Websites that have reliable patient information: 1. American Academy of Asthma, Allergy, and Immunology: www.aaaai.org 2. Food Allergy Research and Education (FARE): foodallergy.org 3. Mothers of Asthmatics: http://www.asthmacommunitynetwork.org 4. American College of Allergy, Asthma, and Immunology: www.acaai.org   COVID-19 Vaccine Information can be found at: [email or web address] For questions related to vaccine distribution or appointments, please email [email or web address] or call [phone].   We realize that you might be concerned about having an allergic reaction to the COVID19 vaccines. To help with that concern, WE ARE OFFERING THE COVID19 VACCINES IN OUR OFFICE! Ask the front desk for dates!     "Like" [place] on Facebook and Instagram for our latest updates!      A healthy  democracy works best when [employer] participate! Make sure you are registered to vote! If you have moved or changed any of your contact information, you will need to get this updated before voting!  In some cases, you MAY be able to register to vote online: [email or web address]

## 2022-10-17 ENCOUNTER — Encounter: Payer: Self-pay | Admitting: Allergy & Immunology

## 2022-10-23 ENCOUNTER — Ambulatory Visit (INDEPENDENT_AMBULATORY_CARE_PROVIDER_SITE_OTHER): Payer: 59

## 2022-10-23 DIAGNOSIS — J4540 Moderate persistent asthma, uncomplicated: Secondary | ICD-10-CM

## 2022-11-08 ENCOUNTER — Ambulatory Visit: Payer: 59 | Admitting: *Deleted

## 2022-11-08 DIAGNOSIS — J4540 Moderate persistent asthma, uncomplicated: Secondary | ICD-10-CM

## 2022-11-22 ENCOUNTER — Ambulatory Visit: Payer: 59

## 2022-11-22 DIAGNOSIS — J4540 Moderate persistent asthma, uncomplicated: Secondary | ICD-10-CM | POA: Diagnosis not present

## 2022-12-11 ENCOUNTER — Ambulatory Visit (INDEPENDENT_AMBULATORY_CARE_PROVIDER_SITE_OTHER): Payer: 59 | Admitting: *Deleted

## 2022-12-11 DIAGNOSIS — J4540 Moderate persistent asthma, uncomplicated: Secondary | ICD-10-CM

## 2022-12-25 ENCOUNTER — Ambulatory Visit: Payer: 59

## 2022-12-25 DIAGNOSIS — J4540 Moderate persistent asthma, uncomplicated: Secondary | ICD-10-CM | POA: Diagnosis not present

## 2022-12-25 NOTE — Progress Notes (Signed)
Immunotherapy   Patient Details  Name: Rebecca Clay MRN: [id number] Date of Birth: 1991-01-14  12/25/2022  Ermalinda Barrios started injections for  Dupixent home administration. Following schedule: Every fourteen days Frequency:Every two weeks. Epi-Pen:Not needed. Consent signed previously and patient instructions given on how to administer into the thighs, abdomen, and arms. Wife successfully administered the Dupixent into the right upper arm after being taught on how to injection into the arm, thigh, and abdomen. Wife and patient both verbally expressed that wife has successfully injections other injections successfully.   Ralene Muskrat 12/25/2022, 5:49 PM

## 2023-02-23 ENCOUNTER — Other Ambulatory Visit: Payer: Self-pay | Admitting: Allergy & Immunology

## 2023-04-16 ENCOUNTER — Ambulatory Visit: Payer: 59 | Admitting: Allergy & Immunology

## 2023-05-09 ENCOUNTER — Other Ambulatory Visit: Payer: Self-pay

## 2023-05-09 ENCOUNTER — Ambulatory Visit: Payer: 59 | Admitting: Allergy & Immunology

## 2023-05-09 VITALS — BP 110/80 | HR 107 | Temp 98.2°F | Resp 12

## 2023-05-09 DIAGNOSIS — J4540 Moderate persistent asthma, uncomplicated: Secondary | ICD-10-CM

## 2023-05-09 DIAGNOSIS — J301 Allergic rhinitis due to pollen: Secondary | ICD-10-CM | POA: Diagnosis not present

## 2023-05-09 DIAGNOSIS — L2089 Other atopic dermatitis: Secondary | ICD-10-CM | POA: Diagnosis not present

## 2023-05-09 MED ORDER — PIMECROLIMUS 1 % EX CREA: TOPICAL_CREAM | Freq: Two times a day (BID) | CUTANEOUS | 5 refills | Status: AC

## 2023-05-09 MED ORDER — MONTELUKAST SODIUM 10 MG PO TABS: 10.0000 mg | ORAL_TABLET | Freq: Every day | ORAL | 1 refills | Status: DC

## 2023-05-09 MED ORDER — TRELEGY ELLIPTA 100-62.5-25 MCG/ACT IN AEPB: 1.0000 | INHALATION_SPRAY | Freq: Every day | RESPIRATORY_TRACT | 1 refills | Status: DC

## 2023-05-09 MED ORDER — ALBUTEROL SULFATE HFA 108 (90 BASE) MCG/ACT IN AERS: 2.0000 | INHALATION_SPRAY | Freq: Four times a day (QID) | RESPIRATORY_TRACT | 1 refills | Status: AC | PRN

## 2023-05-09 MED ORDER — CLOBETASOL PROPIONATE 0.05 % EX OINT: 1.0000 | TOPICAL_OINTMENT | Freq: Two times a day (BID) | CUTANEOUS | 3 refills | Status: AC

## 2023-05-09 NOTE — Progress Notes (Signed)
 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 Moderate persistent asthma, uncomplicated - Lung testing looked amazing today. - I think we are on a good track.  - Continue Trelegy one puff once daily.  - Continue with albuterol as needed.   2. Seasonal allergic rhinitis due to pollen - Continue with cetirizine 10mg  daily. - Continue with the montelukast 10mg  daily.  3. Eczema - well controlled with Dupixent every 2 weeks.  - Continue with the as needed use of the topical creams:  - Triamcinolone ointment twice daily as needed (NOT ON THE FACE) - Clobetasol ointment twice daily as needed (NOT ON THE FACE)  - Pimecrolimus twice daily as needed (SAFE to use over the entire body, including the face).  4. Return in about 6 months (around 11/09/2023). You can have the follow up appointment with Dr. Dellis Anes or a Nurse Practicioner (our Nurse Practitioners are excellent and always have Physician oversight!).    Subjective:   Rebecca Clay is a 33 y.o. 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  Discussed the use of AI scribe software for clinical note transcription with the patient and/or guardian, who gave verbal consent to proceed.  Geneen is a 33 y.o. adult presenting for a follow up visit.  He was last seen in September 2024.  At that time, we continued with Trelegy 1 puff once daily as well as albuterol as needed.  For his allergic rhinitis, we continue with cetirizine as well as montelukast.  Eczema was under excellent control with Dupixent every 2 weeks.  We also continue with as needed appointments.  Since last visit, he has largely done well.  Asthma/Respiratory Symptom History: Asthma is well-controlled with the daily use of Trelegy. He experiences a 'weird taste' in his mouth occasionally, which he manages by rinsing. He rarely uses his rescue inhaler and cannot recall the last time he refilled it, but notes it is valid until next year. Certain smells, particularly from coworkers who smoke, exacerbate his symptoms, causing discomfort when they are nearby. No significant issues with thrush are reported.  Allergic Rhinitis Symptom History: He remains on cetirizine as well as montelukast.  He has not been using any new spray.  He does not use nasal saline rinses all that much.  Overall, he is doing very well.  Skin Symptom History: His skin condition has improved significantly with the use of a cream, likely triamcinolone, which has been effective in managing his symptoms. Drinking water helps, although he attributes some  of his skin issues to the cold weather. He has not been exercising much but hopes to start as the weather improves.  He has a cat at home and mentions that the cat seems to miss the company of another pet. He also discusses the challenges of caring for an elderly dog, who is on multiple medications including trazodone for anxiety, and the difficulties in managing the dog's hygiene and health issues.  His wife is not willing to put the dog to sleep, despite the dog's health problems.   Otherwise, there have been no changes to his past medical history, surgical history, family history, or social history.    Review of systems otherwise negative other than that mentioned in the HPI.    Objective:   Blood pressure 110/80, pulse (!) 107, temperature 98.2 F (36.8 C), resp. rate 12, SpO2 99%. There is no height or weight on file to calculate BMI.    Physical Exam Vitals reviewed.  Constitutional:      Appearance: He is well-developed.     Comments: Smiling.  Pleasant.  HENT:     Head: Normocephalic and atraumatic.     Right Ear: Tympanic membrane, ear canal and external ear normal.     Left Ear: Tympanic membrane, ear canal and external ear normal.     Nose: No nasal deformity, septal deviation, mucosal edema or rhinorrhea.     Right Turbinates: Enlarged and swollen.     Left Turbinates: Enlarged and swollen.     Right Sinus: No maxillary sinus tenderness or frontal sinus tenderness.     Left Sinus: No maxillary sinus tenderness or frontal sinus tenderness.     Mouth/Throat:     Mouth: Mucous membranes are not pale and not dry.     Pharynx: Uvula midline.  Eyes:     General: Lids are normal. No allergic shiner.       Right eye: No discharge.        Left eye: No discharge.     Conjunctiva/sclera: Conjunctivae normal.     Right eye: Right conjunctiva is not injected. No chemosis.    Left eye: Left conjunctiva is not injected. No chemosis.    Pupils: Pupils are equal, round, and reactive to light.  Cardiovascular:     Rate and Rhythm: Normal rate and regular rhythm.     Heart sounds: Normal heart sounds.  Pulmonary:     Effort: Pulmonary effort is normal. No tachypnea, accessory muscle usage or respiratory distress.     Breath sounds: Normal breath sounds. No wheezing, rhonchi or rales.  Chest:     Chest wall: No tenderness.  Lymphadenopathy:     Cervical: No cervical  adenopathy.  Skin:    General: Skin is warm.     Capillary Refill: Capillary refill takes less than 2 seconds.     Coloration: Skin is not pale.     Findings: Rash present. No abrasion, erythema or petechiae. Rash is not papular, urticarial or vesicular.     Comments: Skin looks amazing today.  His lesions on his bilateral lower extremities have healed very well, although there is some hyperpigmentation noted still.  Neurological:     Mental Status: He is alert.  Psychiatric:        Behavior: Behavior is cooperative.      Diagnostic studies:    Spirometry: results normal (FEV1: 4.38/122%, FVC: 4.94/114%, FEV1/FVC: 89%).    Spirometry consistent with normal pattern.    Allergy Studies: none  Malachi Bonds, MD  [facility]

## 2023-05-09 NOTE — Patient Instructions (Addendum)
 1. Moderate persistent asthma, uncomplicated - Lung testing looked amazing today. - I think we are on a good track.  - Continue Trelegy one puff once daily.  - Continue with albuterol as needed.   2. Seasonal allergic rhinitis due to pollen - Continue with cetirizine 10mg  daily. - Continue with the montelukast 10mg  daily.  3. Eczema - well controlled with Dupixent every 2 weeks.  - Continue with the as needed use of the topical creams:  - Triamcinolone ointment twice daily as needed (NOT ON THE FACE) - Clobetasol ointment twice daily as needed (NOT ON THE FACE)  - Pimecrolimus twice daily as needed (SAFE to use over the entire body, including the face).  4. Return in about 6 months (around 11/09/2023). You can have the follow up appointment with Dr. Dellis Anes or a Nurse Practicioner (our Nurse Practitioners are excellent and always have Physician oversight!).    Please inform us of any Emergency Department visits, hospitalizations, or changes in symptoms. Call us before going to the ED for breathing or allergy symptoms since we might be able to fit you in for a sick visit. Feel free to contact us anytime with any questions, problems, or concerns.  It was a pleasure to see you again today!  Websites that have reliable patient information: 1. American Academy of Asthma, Allergy, and Immunology: www.aaaai.org 2. Food Allergy Research and Education (FARE): foodallergy.org 3. Mothers of Asthmatics: http://www.asthmacommunitynetwork.org 4. American College of Allergy, Asthma, and Immunology: www.acaai.org      "Like" [place] on Facebook and Instagram for our latest updates!      A healthy democracy works best when [employer] participate! Make sure you are registered to vote! If you have moved or changed any of your contact information, you will need to get this updated before voting! Scan the QR codes below to learn more!

## 2023-05-13 ENCOUNTER — Encounter: Payer: Self-pay | Admitting: Allergy & Immunology

## 2023-07-02 ENCOUNTER — Encounter (HOSPITAL_BASED_OUTPATIENT_CLINIC_OR_DEPARTMENT_OTHER): Payer: Self-pay | Admitting: Emergency Medicine

## 2023-07-02 ENCOUNTER — Other Ambulatory Visit: Payer: Self-pay

## 2023-07-02 ENCOUNTER — Emergency Department (HOSPITAL_BASED_OUTPATIENT_CLINIC_OR_DEPARTMENT_OTHER)
Admission: EM | Admit: 2023-07-02 | Discharge: 2023-07-02 | Disposition: A | Discharge status: Home / Self Care | Attending: Emergency Medicine | Admitting: Emergency Medicine

## 2023-07-02 DIAGNOSIS — B349 Viral infection, unspecified: Secondary | ICD-10-CM

## 2023-07-02 DIAGNOSIS — J45909 Unspecified asthma, uncomplicated: Secondary | ICD-10-CM | POA: Diagnosis not present

## 2023-07-02 DIAGNOSIS — M791 Myalgia, unspecified site: Secondary | ICD-10-CM | POA: Diagnosis present

## 2023-07-02 DIAGNOSIS — Z91010 Allergy to peanuts: Secondary | ICD-10-CM | POA: Insufficient documentation

## 2023-07-02 DIAGNOSIS — R52 Pain, unspecified: Secondary | ICD-10-CM

## 2023-07-02 LAB — BASIC METABOLIC PANEL WITH GFR
Anion gap: 12 (ref 5–15)
BUN: 8 mg/dL (ref 6–20)
CO2: 25 mmol/L (ref 22–32)
Calcium: 9.3 mg/dL (ref 8.9–10.3)
Chloride: 99 mmol/L (ref 98–111)
Creatinine, Ser: 0.89 mg/dL (ref 0.44–1.00)
GFR, Estimated: >60 mL/min (ref >60)
Glucose, Bld: 104 mg/dL — ABNORMAL HIGH (ref 70–99)
Potassium: 3.7 mmol/L (ref 3.5–5.1)
Sodium: 137 mmol/L (ref 135–145)

## 2023-07-02 LAB — URINALYSIS, ROUTINE W REFLEX MICROSCOPIC
Bacteria, UA: NONE SEEN
Bilirubin Urine: NEGATIVE
Glucose, UA: NEGATIVE mg/dL
Hgb urine dipstick: NEGATIVE
Ketones, ur: NEGATIVE mg/dL
Nitrite: NEGATIVE
Specific Gravity, Urine: 1.018 (ref 1.005–1.030)
pH: 6.5 (ref 5.0–8.0)

## 2023-07-02 LAB — CBC WITH DIFFERENTIAL/PLATELET
Abs Immature Granulocytes: 0.01 10*3/uL (ref 0.00–0.07)
Basophils Absolute: 0 10*3/uL (ref 0.0–0.1)
Basophils Relative: 0 %
Eosinophils Absolute: 0 10*3/uL (ref 0.0–0.5)
Eosinophils Relative: 0 %
HCT: 45.5 % (ref 36.0–46.0)
Hemoglobin: 15.7 g/dL — ABNORMAL HIGH (ref 12.0–15.0)
Immature Granulocytes: 0 %
Lymphocytes Relative: 10 %
Lymphs Abs: 0.6 10*3/uL — ABNORMAL LOW (ref 0.7–4.0)
MCH: 28.3 pg (ref 26.0–34.0)
MCHC: 34.5 g/dL (ref 30.0–36.0)
MCV: 82.1 fL (ref 80.0–100.0)
Monocytes Absolute: 0.4 10*3/uL (ref 0.1–1.0)
Monocytes Relative: 6 %
Neutro Abs: 5 10*3/uL (ref 1.7–7.7)
Neutrophils Relative %: 84 %
Platelets: 156 10*3/uL (ref 150–400)
RBC: 5.54 MIL/uL — ABNORMAL HIGH (ref 3.87–5.11)
RDW: 14.1 % (ref 11.5–15.5)
WBC: 6.1 10*3/uL (ref 4.0–10.5)
nRBC: 0 % (ref 0.0–0.2)

## 2023-07-02 MED ORDER — IBUPROFEN 800 MG PO TABS
800.0000 mg | ORAL_TABLET | Freq: Once | ORAL | Status: AC
  Administered 2023-07-02: 800 mg via ORAL
  Filled 2023-07-02: qty 1

## 2023-07-02 MED ORDER — ACETAMINOPHEN 500 MG PO TABS
1000.0000 mg | ORAL_TABLET | Freq: Once | ORAL | Status: AC
  Administered 2023-07-02: 1000 mg via ORAL
  Filled 2023-07-02: qty 2

## 2023-07-02 NOTE — ED Provider Notes (Signed)
 [place] EMERGENCY DEPARTMENT AT [facility] Provider Note   CSN: [id number] Arrival date & time: 07/02/23  1946     History  Chief Complaint  Patient presents with   Back Pain    Rebecca Clay is a 33 y.o. adult.  Patient here with bodyaches headache chills sweats for the last couple hours.  Patient identifies as female but is biologically female.  Undergoing testosterone  shots.  History of anxiety asthma depression kidney stones.  Patient had testosterone  shot yesterday in the right buttocks and today developed pain throughout the body headaches pain and injection site.  No pain with urination no cough no sputum production.  No neck pain no neck stiffness.  No blood in the urine.  No kidney stone symptoms.  Having some pain in the buttocks area but no specific pain in the back or spine.  Denies any weakness.  Has had some tingling sensation on the right leg at times.  Denies any rash.  The history is provided by the patient.       Home Medications Prior to Admission medications   Medication Sig Start Date End Date Taking? Authorizing Provider  albuterol  (VENTOLIN  HFA) 108 (90 Base) MCG/ACT inhaler Inhale 2 puffs into the lungs every 6 (six) hours as needed for wheezing or shortness of breath. 05/09/23   Rochester Chuck, MD  clobetasol  ointment (TEMOVATE ) 0.05 % Apply 1 Application topically 2 (two) times daily. Use for two weeks at a time max at a time. 05/09/23   Rochester Chuck, MD  ibuprofen  (ADVIL ,MOTRIN ) 800 MG tablet Take 1 tablet (800 mg total) by mouth 3 (three) times daily. 01/02/17   Mackuen, Courteney Lyn, MD  ketoconazole (NIZORAL) 2 % shampoo SMARTSIG:Topical 2-3 Times Weekly 02/21/22   [provider]  methylphenidate 54 MG PO CR tablet Take 54 mg by mouth every morning.    [provider]  montelukast  (SINGULAIR ) 10 MG tablet Take 1 tablet (10 mg total) by mouth daily. 05/09/23   Rochester Chuck, MD  omeprazole (PRILOSEC) 40  MG capsule Take 40 mg by mouth daily.    [provider]  ondansetron  (ZOFRAN -ODT) 4 MG disintegrating tablet Take 1 tablet (4 mg total) by mouth every 8 (eight) hours as needed for nausea or vomiting. 09/11/22   Horton, Vonzella Guernsey, MD  oxyCODONE -acetaminophen  (PERCOCET/ROXICET) 5-325 MG tablet Take 1 tablet by mouth every 6 (six) hours as needed for severe pain. 09/11/22   Horton, Vonzella Guernsey, MD  pimecrolimus  (ELIDEL ) 1 % cream Apply topically 2 (two) times daily. 05/09/23   Rochester Chuck, MD  tamsulosin  (FLOMAX ) 0.4 MG CAPS capsule Take 1 capsule (0.4 mg total) by mouth daily. 09/11/22   Horton, Vonzella Guernsey, MD  testosterone  cypionate (DEPOTESTOSTERONE CYPIONATE) 200 MG/ML injection ADMINISTER 0.4 ML(80 MG) IN THE MUSCLE 1 TIME A WEEK 01/27/18   Cranston Dk, MD  TRELEGY ELLIPTA  100-62.5-25 MCG/ACT AEPB Inhale 1 puff into the lungs daily. 05/09/23   Rochester Chuck, MD  triamcinolone  ointment (KENALOG ) 0.1 % Apply 1 Application topically 2 (two) times daily as needed. 02/08/22   Rochester Chuck, MD      Allergies    Bee pollen, Glycerin, Peanut-containing drug products, and Pollen extract    Review of Systems   Review of Systems  Physical Exam Updated Vital Signs BP 130/87   Pulse (!) 110   Temp 99 F (37.2 C)   Resp 18   SpO2 97%  Physical Exam Vitals and nursing  note reviewed.  Constitutional:      General: He is not in acute distress.    Appearance: He is well-developed. He is not ill-appearing.  HENT:     Head: Normocephalic and atraumatic.     Nose: Nose normal.     Mouth/Throat:     Mouth: Mucous membranes are moist.  Eyes:     Extraocular Movements: Extraocular movements intact.     Conjunctiva/sclera: Conjunctivae normal.     Pupils: Pupils are equal, round, and reactive to light.  Neck:     Vascular: No carotid bruit.  Cardiovascular:     Rate and Rhythm: Normal rate and regular rhythm.     Pulses: Normal pulses.     Heart sounds: Normal  heart sounds. No murmur heard. Pulmonary:     Effort: Pulmonary effort is normal. No respiratory distress.     Breath sounds: Normal breath sounds.  Abdominal:     Palpations: Abdomen is soft.     Tenderness: There is no abdominal tenderness.  Musculoskeletal:        General: Tenderness present. No swelling.     Cervical back: Normal range of motion and neck supple. No rigidity or tenderness.     Comments: Tenderness to right buttock site but there is no redness swelling fluctuance at injection site in the right buttocks, there is no midline spinal pain or tenderness  Lymphadenopathy:     Cervical: No cervical adenopathy.  Skin:    General: Skin is warm and dry.     Capillary Refill: Capillary refill takes less than 2 seconds.  Neurological:     General: No focal deficit present.     Mental Status: He is alert and oriented to person, place, and time.     Cranial Nerves: No cranial nerve deficit.     Sensory: No sensory deficit.     Motor: No weakness.     Coordination: Coordination normal.     Comments: 5+ out of 5 strength throughout, normal sensation, no drift, normal speech, normal visual fields  Psychiatric:        Mood and Affect: Mood normal.     ED Results / Procedures / Treatments   Labs (all labs ordered are listed, but only abnormal results are displayed) Labs Reviewed  CBC WITH DIFFERENTIAL/PLATELET - Abnormal; Notable for the following components:      Result Value   RBC 5.54 (*)    Hemoglobin 15.7 (*)    Lymphs Abs 0.6 (*)    All other components within normal limits  BASIC METABOLIC PANEL WITH GFR - Abnormal; Notable for the following components:   Glucose, Bld 104 (*)    All other components within normal limits  URINALYSIS, ROUTINE W REFLEX MICROSCOPIC - Abnormal; Notable for the following components:   Protein, ur TRACE (*)    Leukocytes,Ua SMALL (*)    All other components within normal limits  CBG MONITORING, ED    EKG EKG  Interpretation Date/Time:  Tuesday Jul 02 2023 20:04:37 EDT Ventricular Rate:  132 PR Interval:  130 QRS Duration:  78 QT Interval:  280 QTC Calculation: 414 R Axis:   48  Text Interpretation: Sinus tachycardia Cannot rule out Anterior infarct , age undetermined Abnormal ECG When compared with ECG of 08-Apr-2013 14:32, PREVIOUS ECG IS PRESENT Confirmed by Lowery Rue [phone]) on 07/02/2023 8:08:29 PM  Radiology No results found.  Procedures Procedures    Medications Ordered in ED Medications  acetaminophen  (TYLENOL ) tablet 1,000 mg (1,000 mg  Oral Given 07/02/23 2139)  ibuprofen  (ADVIL ) tablet 800 mg (800 mg Oral Given 07/02/23 2139)    ED Course/ Medical Decision Making/ A&P                                 Medical Decision Making Amount and/or Complexity of Data Reviewed Labs: ordered.  Risk OTC drugs. Prescription drug management.   AMIKA TASSIN is here with bodyaches chills sweats, pain at injection site.  Patient biologically female but identifies as female.  History of anxiety asthma depression.  Undergoing testosterone  shots.  On Dupixent  for eczema.  Overall had testosterone  shot in the right buttocks yesterday having pain at that injection site but then started having bodyaches headache chills myalgias throughout.  Not having any focal pain.  No abdominal pain no focal back pain.  Has some pain at the injection site in the right buttocks but this area is clean dry without any erythema swelling or purulent drainage.  Patient has no meningeal signs.  No urinary symptoms no cough no sputum production.  Low-grade temperature here of 99.  Mildly tachycardic in the low 100.  Blood pressure unremarkable.  Differential diagnosis likely viral process but could be testosterone  side effect as well.  However has been doing testosterone  shots for a while.  Patient has no rash.  No flank pain.  Does not feel like prior kidney stones.  Adamantly labs were obtained including CBC BMP  urinalysis and EKG.  EKG shows sinus rhythm.  No ischemic changes.  Patient had no significant leukocytosis anemia or electrolyte abnormality.  Urinalysis negative for infection.  No blood in the urine.  Patient feeling better after Tylenol  and ibuprofen .  Saw primary care doctor today who gave what sounds like Toradol  and Decadron shot as concerned may be pain on the right leg was from sciatic nerve irritation from steroid shot that was in the right gluteus area.  I agree that could possibly be going on but given body aches headaches myalgias I do think a viral process or medication side effect might be going on as well.  Right now is not having any focal pain.  I am not worried about a spinal abscess or meningitis or appendicitis or other acute process at this time.  Neurological exam is unremarkable.  No midline spinal tenderness.  We discussed all these different type of things that he needs to be aware of and talked about return precautions but at this time I think he likely just has a viral process.  He understands return precautions.  Discharged in good condition.  This chart was dictated using voice recognition software.  Despite best efforts to proofread,  errors can occur which can change the documentation meaning.         Final Clinical Impression(s) / ED Diagnoses Final diagnoses:  Body aches  Viral syndrome    Rx / DC Orders ED Discharge Orders     None         Lowery Rue, DO 07/02/23 2248

## 2023-07-02 NOTE — ED Triage Notes (Signed)
 Back pain into legs  and headache started yesterday.  Took rx pain meloxicam minimal relief.  Chills, increased thirst Seen PCP today given pain shots, helped some  Takes testosterone  shot. Pain started after receiving last shot concerned it may have hit a nerve

## 2023-07-02 NOTE — Discharge Instructions (Signed)
 Continue 800 mg ibuprofen  every 8 hours as needed for pain.  Continue 1000 mg of Tylenol  every 6 hours as needed for pain.  Overall right now I thank you are having a mild side effect from testosterone  or have been viral process.  As discussed if you develop any focal symptoms such as focal spine pain, focal abdominal pain, cough sputum production pain with urination or other concerning symptoms please return for evaluation.

## 2023-07-04 ENCOUNTER — Other Ambulatory Visit (HOSPITAL_COMMUNITY): Payer: Self-pay | Admitting: Internal Medicine

## 2023-07-04 DIAGNOSIS — R109 Unspecified abdominal pain: Secondary | ICD-10-CM

## 2023-07-04 DIAGNOSIS — R112 Nausea with vomiting, unspecified: Secondary | ICD-10-CM

## 2023-07-04 DIAGNOSIS — R509 Fever, unspecified: Secondary | ICD-10-CM

## 2023-07-05 ENCOUNTER — Ambulatory Visit (HOSPITAL_COMMUNITY)
Admission: RE | Admit: 2023-07-05 | Discharge: 2023-07-05 | Disposition: A | Discharge status: Home / Self Care | Source: Ambulatory Visit | Attending: Internal Medicine | Admitting: Internal Medicine

## 2023-07-05 DIAGNOSIS — R112 Nausea with vomiting, unspecified: Secondary | ICD-10-CM | POA: Insufficient documentation

## 2023-07-05 DIAGNOSIS — R509 Fever, unspecified: Secondary | ICD-10-CM | POA: Insufficient documentation

## 2023-07-05 DIAGNOSIS — R109 Unspecified abdominal pain: Secondary | ICD-10-CM | POA: Diagnosis present

## 2023-08-20 ENCOUNTER — Other Ambulatory Visit: Payer: Self-pay | Admitting: Allergy & Immunology

## 2023-09-05 ENCOUNTER — Telehealth: Payer: Self-pay

## 2023-09-05 NOTE — Telephone Encounter (Signed)
*  [facility]  Pharmacy Patient Advocate Encounter  Received notification from [facility] that Prior Authorization for Trelegy Ellipta  100-62.5-25MCG/ACT aerosol powder  has been APPROVED   Your PA has been resolved, no additional PA is required. For further inquiries please contact the number on the back of the member prescription card. (Message [phone])

## 2023-10-15 ENCOUNTER — Other Ambulatory Visit (HOSPITAL_COMMUNITY): Payer: Self-pay

## 2023-11-05 ENCOUNTER — Other Ambulatory Visit: Payer: Self-pay | Admitting: Allergy & Immunology

## 2023-11-07 ENCOUNTER — Other Ambulatory Visit: Payer: Self-pay

## 2023-11-07 ENCOUNTER — Encounter: Payer: Self-pay | Admitting: Allergy & Immunology

## 2023-11-07 ENCOUNTER — Ambulatory Visit: Admitting: Allergy & Immunology

## 2023-11-07 VITALS — BP 130/80 | HR 109 | Temp 97.6°F | Resp 18 | Ht 67.72 in | Wt 170.8 lb

## 2023-11-07 DIAGNOSIS — J301 Allergic rhinitis due to pollen: Secondary | ICD-10-CM

## 2023-11-07 DIAGNOSIS — J4540 Moderate persistent asthma, uncomplicated: Secondary | ICD-10-CM | POA: Diagnosis not present

## 2023-11-07 DIAGNOSIS — L2089 Other atopic dermatitis: Secondary | ICD-10-CM

## 2023-11-07 MED ORDER — TRELEGY ELLIPTA 100-62.5-25 MCG/ACT IN AEPB: 1.0000 | INHALATION_SPRAY | Freq: Every day | RESPIRATORY_TRACT | 1 refills | Status: AC

## 2023-11-07 MED ORDER — MONTELUKAST SODIUM 10 MG PO TABS: 10.0000 mg | ORAL_TABLET | Freq: Every day | ORAL | 1 refills | Status: AC

## 2023-11-07 MED ORDER — OMEPRAZOLE 40 MG PO CPDR: 40.0000 mg | DELAYED_RELEASE_CAPSULE | Freq: Every day | ORAL | 1 refills | Status: AC

## 2023-11-07 NOTE — Patient Instructions (Addendum)
 1. Moderate persistent asthma, uncomplicated - Lung testing looked amazing today. - I think we are on a good track.  - Continue Trelegy one puff once daily.  - Continue with albuterol  as needed.   2. Seasonal allergic rhinitis due to pollen - Repeat allergy testing ordered today via the blood. - We will call you in 1-2 weeks with the results of the testing. - Stop cetirizine and start fexofeandine instead (Allegra).  - Continue with the montelukast  10mg  daily.  3. Eczema - well controlled with Dupixent  every 2 weeks.  - Continue with the as needed use of the topical creams:  - Triamcinolone  ointment twice daily as needed (NOT ON THE FACE) - Clobetasol  ointment twice daily as needed (NOT ON THE FACE)  - Pimecrolimus  twice daily as needed (SAFE to use over the entire body, including the face).  4. Return in about 6 months (around 05/06/2024). You can have the follow up appointment with Dr. Iva or a Nurse Practicioner (our Nurse Practitioners are excellent and always have Physician oversight!).    Please inform us  of any Emergency Department visits, hospitalizations, or changes in symptoms. Call us  before going to the ED for breathing or allergy symptoms since we might be able to fit you in for a sick visit. Feel free to contact us  anytime with any questions, problems, or concerns.  It was a pleasure to see you again today!  Websites that have reliable patient information: 1. American Academy of Asthma, Allergy, and Immunology: www.aaaai.org 2. Food Allergy Research and Education (FARE): foodallergy.org 3. Mothers of Asthmatics: http://www.asthmacommunitynetwork.org 4. American College of Allergy, Asthma, and Immunology: www.acaai.org      "Like" us  on Facebook and Instagram for our latest updates!      A healthy democracy works best when [employer] participate! Make sure you are registered to vote! If you have moved or changed any of your contact information, you will need  to get this updated before voting! Scan the QR codes below to learn more!

## 2023-11-07 NOTE — Progress Notes (Signed)
 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 Moderate persistent asthma, uncomplicated - Lung testing looked amazing today. - I think we are on a good track.  - Continue Trelegy one puff once daily.  - Continue with albuterol  as needed.   2. Seasonal allergic rhinitis due to pollen - Repeat allergy testing ordered today via the blood. - We will call you in 1-2 weeks with the results of the testing. - Stop cetirizine and start fexofeandine instead (Allegra).  - Continue with the montelukast  10mg  daily.  3. Eczema - well controlled with Dupixent  every 2 weeks.  - Continue with the as needed use of the topical creams:  - Triamcinolone  ointment twice daily as needed (NOT ON THE FACE) - Clobetasol  ointment twice daily as needed (NOT ON THE FACE)  - Pimecrolimus  twice daily as needed (SAFE to use over the entire body, including the face).  4. Return in about 6 months (around 05/06/2024). You can have the follow up appointment with Dr. Iva or a Nurse Practicioner (our Nurse Practitioners are excellent and always have Physician oversight!).   Subjective:   Rebecca Clay is a 33 y.o. 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  Discussed the use of AI scribe software for clinical note transcription with the patient and/or guardian, who gave verbal consent to proceed.  Rebecca Clay is a 33 y.o. adult presenting for a follow up visit.  He was last seen in March 2025.  At that time, lung testing looked amazing.  We continue with Trelegy 1 puff once daily as well as albuterol  as needed.  For his allergic rhinitis, we continue his cetirizine as well as montelukast .  Eczema was controlled with Dupixent  every 2 weeks.  He also had triamcinolone  and clobetasol  as well as pimecrolimus  to use as needed.  Since last visit, he has done very well.  Asthma/Respiratory Symptom History: His asthma is well-controlled with minimal use of his albuterol  inhaler, which he uses in the morning. He feels well most of the time and has not required the inhaler for exercise. No nocturnal symptoms are reported.  Allergic Rhinitis Symptom History: He experiences nasal congestion due to allergies, particularly at night, which affects his sleep. He uses Singulair  and Zyrtec primarily at night. He dislikes nasal sprays and has not had recent sinus or ear infections. No coughing at night, sinus infections, or ear infections. He reports a stuffy nose due to allergies and dislikes nasal sprays.   Skin Symptom History: His atopic  dermatitis is generally well-controlled with Dupixent , with only one recent flare-up; he is unsure if something bit him. He uses clobetasol  for flare-ups but does not use it frequently. He mentions recent scratches from a new kitten, which has sharp nails and is teething.  Otherwise, there have been no changes to his past medical history, surgical history, family history, or social history.    Review of systems  otherwise negative other than that mentioned in the HPI.    Objective:   Blood pressure 130/80, pulse (!) 109, temperature 97.6 F (36.4 C), temperature source Temporal, resp. rate 18, height 5' 7.72 (1.72 m), weight 170 lb 12.8 oz (77.5 kg). Body mass index is 26.19 kg/m.    Physical Exam Vitals reviewed.  Constitutional:      Appearance: He is well-developed.     Comments: Smiling.  Pleasant. Joyful.   HENT:     Head: Normocephalic and atraumatic.     Right Ear: Tympanic membrane, ear canal and external ear normal.     Left Ear: Tympanic membrane, ear canal and external ear normal.     Nose: No nasal deformity, septal deviation, mucosal edema or rhinorrhea.     Right Turbinates: Enlarged and swollen.     Left Turbinates: Enlarged and swollen.     Right Sinus: No maxillary sinus tenderness or frontal sinus tenderness.     Left Sinus: No maxillary sinus tenderness or frontal sinus tenderness.     Mouth/Throat:     Mouth: Mucous membranes are not pale and not dry.     Pharynx: Uvula midline.  Eyes:     General: Lids are normal. No allergic shiner.       Right eye: No discharge.        Left eye: No discharge.     Conjunctiva/sclera: Conjunctivae normal.     Right eye: Right conjunctiva is not injected. No chemosis.    Left eye: Left conjunctiva is not injected. No chemosis.    Pupils: Pupils are equal, round, and reactive to light.  Cardiovascular:     Rate and Rhythm: Normal rate and regular rhythm.     Heart sounds: Normal heart sounds.  Pulmonary:     Effort: Pulmonary effort is normal. No tachypnea, accessory muscle usage or respiratory distress.     Breath sounds: Normal breath sounds. No wheezing, rhonchi or rales.  Chest:     Chest wall: No tenderness.  Lymphadenopathy:     Cervical: No cervical adenopathy.  Skin:    General: Skin is warm.     Capillary Refill: Capillary refill takes less than 2 seconds.     Coloration: Skin is not pale.     Findings: Rash  present. No abrasion, erythema or petechiae. Rash is not papular, urticarial or vesicular.     Comments: Skin looks amazing today.  His lesions on his bilateral lower extremities have healed very well, although there is some hyperpigmentation noted still.  Neurological:     Mental Status: He is alert.  Psychiatric:        Behavior: Behavior is cooperative.      Diagnostic studies: none        Marty Shaggy, MD  [facility] 

## 2023-11-11 LAB — ALLERGENS W/COMP RFLX AREA 2
Alternaria Alternata IgE: <0.1 kU/L
Aspergillus Fumigatus IgE: <0.1 kU/L
Bermuda Grass IgE: <0.1 kU/L
Cedar, Mountain IgE: 0.3 kU/L — AB
Cladosporium Herbarum IgE: <0.1 kU/L
Cockroach, German IgE: <0.1 kU/L
Common Silver Birch IgE: <0.1 kU/L
Cottonwood IgE: <0.1 kU/L
D Farinae IgE: <0.1 kU/L
D Pteronyssinus IgE: <0.1 kU/L
E001-IgE Cat Dander: <0.1 kU/L
E005-IgE Dog Dander: <0.1 kU/L
Elm, American IgE: <0.1 kU/L
IgE (Immunoglobulin E), Serum: 14 [IU]/mL (ref 6–495)
Johnson Grass IgE: <0.1 kU/L
Maple/Box Elder IgE: <0.1 kU/L
Mouse Urine IgE: <0.1 kU/L
Oak, White IgE: <0.1 kU/L
Pecan, Hickory IgE: <0.1 kU/L
Penicillium Chrysogen IgE: <0.1 kU/L
Pigweed, Rough IgE: <0.1 kU/L
Ragweed, Short IgE: <0.1 kU/L
Sheep Sorrel IgE Qn: <0.1 kU/L
Timothy Grass IgE: <0.1 kU/L
White Mulberry IgE: <0.1 kU/L

## 2023-11-17 ENCOUNTER — Ambulatory Visit: Payer: Self-pay | Admitting: Allergy & Immunology

## 2024-05-07 ENCOUNTER — Ambulatory Visit: Admitting: Allergy & Immunology
